# Patient Record
Sex: Male | Born: 1955 | Race: White | Hispanic: No | Marital: Married | Smoking: Former smoker
Health system: Southern US, Community
[De-identification: ages and names within clinical notes are randomized; demographics above are authoritative.]

## PROBLEM LIST (undated history)

## (undated) DIAGNOSIS — I10 Essential (primary) hypertension: Secondary | ICD-10-CM

## (undated) DIAGNOSIS — T7840XA Allergy, unspecified, initial encounter: Secondary | ICD-10-CM

## (undated) DIAGNOSIS — E119 Type 2 diabetes mellitus without complications: Secondary | ICD-10-CM

## (undated) HISTORY — DX: Essential (primary) hypertension: I10

## (undated) HISTORY — PX: TONSILLECTOMY: SUR1361

## (undated) HISTORY — DX: Allergy, unspecified, initial encounter: T78.40XA

## (undated) HISTORY — DX: Type 2 diabetes mellitus without complications: E11.9

---

## 2004-10-12 ENCOUNTER — Ambulatory Visit: Payer: Self-pay | Admitting: Gastroenterology

## 2004-10-20 ENCOUNTER — Ambulatory Visit: Payer: Self-pay | Admitting: Gastroenterology

## 2005-02-15 ENCOUNTER — Ambulatory Visit: Payer: Self-pay | Admitting: Gastroenterology

## 2009-07-01 ENCOUNTER — Encounter: Admission: RE | Admit: 2009-07-01 | Discharge: 2009-09-10 | Payer: Self-pay | Admitting: Internal Medicine

## 2009-12-16 ENCOUNTER — Encounter
Admission: RE | Admit: 2009-12-16 | Discharge: 2010-03-16 | Payer: Self-pay | Source: Home / Self Care | Admitting: Internal Medicine

## 2010-05-24 ENCOUNTER — Encounter
Admission: RE | Admit: 2010-05-24 | Discharge: 2010-07-29 | Payer: Self-pay | Source: Home / Self Care | Admitting: Internal Medicine

## 2010-08-25 ENCOUNTER — Encounter
Admission: RE | Admit: 2010-08-25 | Discharge: 2010-08-25 | Payer: Self-pay | Source: Home / Self Care | Attending: Internal Medicine | Admitting: Internal Medicine

## 2010-12-01 ENCOUNTER — Ambulatory Visit: Payer: Self-pay | Admitting: Dietician

## 2010-12-01 ENCOUNTER — Encounter: Admit: 2010-12-01 | Payer: Self-pay | Admitting: Internal Medicine

## 2011-12-31 ENCOUNTER — Other Ambulatory Visit: Payer: Self-pay | Admitting: Internal Medicine

## 2011-12-31 MED ORDER — METFORMIN HCL ER 500 MG PO TB24: 1000.0000 mg | ORAL_TABLET | Freq: Every day | ORAL | Status: DC

## 2012-02-03 ENCOUNTER — Ambulatory Visit (INDEPENDENT_AMBULATORY_CARE_PROVIDER_SITE_OTHER): Payer: 59 | Admitting: Internal Medicine

## 2012-02-03 VITALS — BP 102/64 | HR 80 | Temp 98.6°F | Resp 16 | Ht 68.58 in | Wt 226.2 lb

## 2012-02-03 DIAGNOSIS — F419 Anxiety disorder, unspecified: Secondary | ICD-10-CM

## 2012-02-03 DIAGNOSIS — E785 Hyperlipidemia, unspecified: Secondary | ICD-10-CM

## 2012-02-03 DIAGNOSIS — E119 Type 2 diabetes mellitus without complications: Secondary | ICD-10-CM

## 2012-02-03 DIAGNOSIS — Z6833 Body mass index (BMI) 33.0-33.9, adult: Secondary | ICD-10-CM

## 2012-02-03 DIAGNOSIS — J309 Allergic rhinitis, unspecified: Secondary | ICD-10-CM | POA: Insufficient documentation

## 2012-02-03 LAB — POCT GLYCOSYLATED HEMOGLOBIN (HGB A1C): Hemoglobin A1C: 6.9

## 2012-02-03 MED ORDER — METFORMIN HCL ER (OSM) 1000 MG PO TB24: 1000.0000 mg | ORAL_TABLET | Freq: Every day | ORAL | Status: DC

## 2012-02-03 MED ORDER — LISINOPRIL 2.5 MG PO TABS: 2.5000 mg | ORAL_TABLET | Freq: Every day | ORAL | Status: DC

## 2012-02-03 NOTE — Patient Instructions (Signed)
Reviewing your past records indicated that the pharmacy has misunderstood your prescription. I have called in a new prescription so that you can take all ofyour medication for diabetes in one dose at suppertime/// so you'll no longer have to do this twice a day. Your correct dose is 1000 mg of an extended release tablet at supper. It's also possible for you to take your lisinopril at the same time. You're due for recheck with repeat laboratory in the months and your prescription should last until that time.

## 2012-02-03 NOTE — Progress Notes (Signed)
  Subjective:    Patient ID: Steve Hall, male    DOB: [birth year], 56 y.o.   MRN: [id number]  HPI He was told by his insurance company or his pharmacy that he needed a visit to get more medication. When he was here in November his medications were refilled for one year. There are several instances where his dose has been confused between twice a day and once a day regimens and he is still not clear but appears to be taking 1000 mg a day in 2 divided doses by breaking his pill in half.  He is doing well but has not lost weight/he is just starting gym this week   Review of SystemsNo new symptoms     Objective:   Physical ExamVital signs are stable with elevated weight Heart is regular without murmurs rubs and gallops There no sensory deficits of the feet  Results for orders placed in visit on 02/03/12  POCT GLYCOSYLATED HEMOGLOBIN (HGB A1C)      Component Value Range   Hemoglobin A1C 6.9         Assessment & Plan:  Problem #1 adult onset diabetes He should be taking 1000 mg extended release metformin at supper #92 refills recheck at regular physical in 9 months/He will call the pharmacy charges more at this dose and wants him to use the 500 mg extended release twice a day/he will continue checking random morning blood sugars and followup sooner if these are out of control Lisinopril 2.5 mg #90 one daily with 2 refills for kidney protection His last LDL was just over 100/he has had a past reaction to statins and so we will approach this problem with exercise and diet  His big task is to lose weight!!!

## 2012-06-03 ENCOUNTER — Other Ambulatory Visit: Payer: Self-pay | Admitting: Internal Medicine

## 2012-08-26 ENCOUNTER — Other Ambulatory Visit: Payer: Self-pay | Admitting: Internal Medicine

## 2012-09-23 ENCOUNTER — Ambulatory Visit (INDEPENDENT_AMBULATORY_CARE_PROVIDER_SITE_OTHER): Payer: 59 | Admitting: Internal Medicine

## 2012-09-23 VITALS — BP 125/79 | HR 78 | Temp 97.6°F | Resp 18 | Ht 68.5 in | Wt 231.0 lb

## 2012-09-23 DIAGNOSIS — J309 Allergic rhinitis, unspecified: Secondary | ICD-10-CM

## 2012-09-23 DIAGNOSIS — E785 Hyperlipidemia, unspecified: Secondary | ICD-10-CM

## 2012-09-23 DIAGNOSIS — Z6833 Body mass index (BMI) 33.0-33.9, adult: Secondary | ICD-10-CM

## 2012-09-23 DIAGNOSIS — E119 Type 2 diabetes mellitus without complications: Secondary | ICD-10-CM

## 2012-09-23 DIAGNOSIS — Z0000 Encounter for general adult medical examination without abnormal findings: Secondary | ICD-10-CM

## 2012-09-23 DIAGNOSIS — B356 Tinea cruris: Secondary | ICD-10-CM

## 2012-09-23 DIAGNOSIS — F419 Anxiety disorder, unspecified: Secondary | ICD-10-CM

## 2012-09-23 DIAGNOSIS — L02419 Cutaneous abscess of limb, unspecified: Secondary | ICD-10-CM

## 2012-09-23 DIAGNOSIS — Z23 Encounter for immunization: Secondary | ICD-10-CM

## 2012-09-23 DIAGNOSIS — Z125 Encounter for screening for malignant neoplasm of prostate: Secondary | ICD-10-CM

## 2012-09-23 DIAGNOSIS — Z2839 Other underimmunization status: Secondary | ICD-10-CM

## 2012-09-23 LAB — LIPID PANEL
Cholesterol: 198 mg/dL (ref 0–200)
Total CHOL/HDL Ratio: 5.4 Ratio

## 2012-09-23 LAB — COMPREHENSIVE METABOLIC PANEL
ALT: 25 U/L (ref 0–53)
AST: 18 U/L (ref 0–37)
Albumin: 4.7 g/dL (ref 3.5–5.2)
Alkaline Phosphatase: 65 U/L (ref 39–117)
Calcium: 10.2 mg/dL (ref 8.4–10.5)
Chloride: 96 mEq/L (ref 96–112)
Potassium: 4.8 mEq/L (ref 3.5–5.3)

## 2012-09-23 LAB — POCT UA - MICROSCOPIC ONLY
Casts, Ur, LPF, POC: NEGATIVE
Crystals, Ur, HPF, POC: NEGATIVE

## 2012-09-23 LAB — TSH: TSH: 1.963 u[IU]/mL (ref 0.350–4.500)

## 2012-09-23 LAB — POCT CBC
Granulocyte percent: 58.8 %G (ref 37–80)
Hemoglobin: 15.4 g/dL (ref 14.1–18.1)
MID (cbc): 0.7 (ref 0–0.9)
MPV: 7.2 fL (ref 0–99.8)
POC Granulocyte: 4.6 (ref 2–6.9)
POC MID %: 8.4 %M (ref 0–12)
Platelet Count, POC: 299 10*3/uL (ref 142–424)
RBC: 5.01 M/uL (ref 4.69–6.13)

## 2012-09-23 LAB — POCT GLYCOSYLATED HEMOGLOBIN (HGB A1C): Hemoglobin A1C: 7.6

## 2012-09-23 LAB — POCT URINALYSIS DIPSTICK
Ketones, UA: NEGATIVE
Protein, UA: NEGATIVE
Spec Grav, UA: 1.015
Urobilinogen, UA: 0.2
pH, UA: 7

## 2012-09-23 MED ORDER — METFORMIN HCL ER (OSM) 1000 MG PO TB24: 1000.0000 mg | ORAL_TABLET | Freq: Every day | ORAL | Status: DC

## 2012-09-23 MED ORDER — LISINOPRIL 2.5 MG PO TABS: 2.5000 mg | ORAL_TABLET | Freq: Every day | ORAL | Status: DC

## 2012-09-23 MED ORDER — KETOCONAZOLE 2 % EX CREA: TOPICAL_CREAM | Freq: Every day | CUTANEOUS | Status: DC

## 2012-09-23 MED ORDER — DOXYCYCLINE HYCLATE 100 MG PO TABS: 100.0000 mg | ORAL_TABLET | Freq: Two times a day (BID) | ORAL | Status: DC

## 2012-09-23 MED ORDER — GLUCOSE BLOOD VI STRP: ORAL_STRIP | Status: DC

## 2012-09-23 NOTE — Progress Notes (Signed)
Subjective:    Patient ID: Steve Hall, male    DOB: 09-05-1956, 56 y.o.   MRN: [id number]  HPIannual PE Patient Active Problem List  Diagnosis  . DM (diabetes mellitus)  . Allergic rhinitis  . BMI 33.0-33.9,adult  . Hyperlipidemia  . Anxiety   Doing well/feels well/has gained 5-10 lbs but says probably "muscle" anx stable DM-tests freq and is OK  Never pneumnovax Colonoscopy2-3 yr ago Shingles vac too expensive cause wouldn't allow pharm admin Td 08 joined [employer] recently up to 3x /wk Former smoker Statin allergy Review of Systems Epistaxis L nostril 6 mos   no vision changes No chest pain or palpitations No peripheral edema No dyspnea on exertion No abdominal pain No peripheral sensory losses Has a funny tender area on left leg that he thinks is a spider bite present for one week Rest review of systems negative Objective:   Physical Exam Vital signs stable except weight 231 pounds HEENT clear No thyromegaly or lymphadenopathy Heart regular without murmur/no carotid bruits Abdomen Central obesity/no organomegaly or masses/no current abdominal bruit Extremities without edema/no sensory losses and feet to filament testing Neurological intact Skin with 3 cm red papule with 0.3 cm central pustule at the left popliteal area Psychological stable Tinea cruris present       Results for orders placed in visit on 09/23/12  LIPID PANEL      Component Value Range   Cholesterol 198  0 - 200 mg/dL   Triglycerides 409 (*) <150 mg/dL   HDL 37 (*) >81 mg/dL   Total CHOL/HDL Ratio 5.4     VLDL 50 (*) 0 - 40 mg/dL   LDL Cholesterol 191 (*) 0 - 99 mg/dL  POCT GLYCOSYLATED HEMOGLOBIN (HGB A1C)      Component Value Range   Hemoglobin A1C 7.6    POCT CBC      Component Value Range   WBC 7.8  4.6 - 10.2 K/uL   Lymph, poc 2.6  0.6 - 3.4   POC LYMPH PERCENT 32.8  10 - 50 %L   MID (cbc) 0.7  0 - 0.9   POC MID % 8.4  0 - 12 %M   POC Granulocyte 4.6  2 - 6.9   Granulocyte percent 58.8  37 - 80 %G   RBC 5.01  4.69 - 6.13 M/uL   Hemoglobin 15.4  14.1 - 18.1 g/dL   HCT, POC 47.8  29.5 - 53.7 %   MCV 94.3  80 - 97 fL   MCH, POC 30.7  27 - 31.2 pg   MCHC 32.6  31.8 - 35.4 g/dL   RDW, POC 62.1     Platelet Count, POC 299  142 - 424 K/uL   MPV 7.2  0 - 99.8 fL  TSH      Component Value Range   TSH 1.963  0.350 - 4.500 uIU/mL  COMPREHENSIVE METABOLIC PANEL      Component Value Range   Sodium 133 (*) 135 - 145 mEq/L   Potassium 4.8  3.5 - 5.3 mEq/L   Chloride 96  96 - 112 mEq/L   CO2 30  19 - 32 mEq/L   Glucose, Bld 133 (*) 70 - 99 mg/dL   BUN 15  6 - 23 mg/dL   Creat 3.08  6.57 - 8.46 mg/dL   Total Bilirubin 0.9  0.3 - 1.2 mg/dL   Alkaline Phosphatase 65  39 - 117 U/L   AST 18  0 - 37 U/L  ALT 25  0 - 53 U/L   Total Protein 7.3  6.0 - 8.3 g/dL   Albumin 4.7  3.5 - 5.2 g/dL   Calcium 40.9  8.4 - 81.1 mg/dL  PSA      Component Value Range   PSA    <=4.00 ng/mL  POCT UA - MICROSCOPIC ONLY      Component Value Range   WBC, Ur, HPF, POC 3-5     RBC, urine, microscopic 0-1     Bacteria, U Microscopic trace     Mucus, UA neg     Epithelial cells, urine per micros 1-2     Crystals, Ur, HPF, POC neg     Casts, Ur, LPF, POC neg     Yeast, UA neg    POCT URINALYSIS DIPSTICK      Component Value Range   Color, UA yellow     Clarity, UA clear     Glucose, UA neg     Bilirubin, UA neg     Ketones, UA neg     Spec Grav, UA 1.015     Blood, UA neg     pH, UA 7.0     Protein, UA neg     Urobilinogen, UA 0.2     Nitrite, UA neg     Leukocytes, UA Negative    MICROALBUMIN, URINE      Component Value Range   Microalb, Ur    0.00 - 1.89 mg/dL    Assessment & Plan:  Annual physical exam 1. Annual physical exam    2. Prostate cancer screening  POCT CBC, TSH, Comprehensive metabolic panel, PSA  3. Type II or unspecified type diabetes mellitus without mention of complication, not stated as uncontrolled  Lipid panel, POCT glycosylated  hemoglobin (Hb A1C), POCT CBC, TSH, Comprehensive metabolic panel, POCT UA - Microscopic Only, POCT urinalysis dipstick, Microalbumin, urine, lisinopril (PRINIVIL,ZESTRIL) 2.5 MG tablet, metformin (FORTAMET) 1000 MG (OSM) 24 hr tablet, glucose blood (ONE TOUCH ULTRA TEST) test strip  4. Hyperlipidemia   history of statin allergy   5. Anxiety    6. Allergic rhinitis    7. Abscess of leg  Wound culture  8. BMI 33.0-33.9,adult    9. Tinea cruris  ketoconazole (NIZORAL) 2 % cream  10. Not vaccinated against Streptococcus pneumoniae  Pneumococcal polysaccharide vaccine 23-valent greater than or equal to 2yo subcutaneous/IM   Start doxycycline Meds ordered this encounter  Medications  . lisinopril (PRINIVIL,ZESTRIL) 2.5 MG tablet    Sig: Take 1 tablet (2.5 mg total) by mouth daily.    Dispense:  90 tablet    Refill:  3    Needs office visit and labs  . metformin (FORTAMET) 1000 MG (OSM) 24 hr tablet    Sig: Take 1 tablet (1,000 mg total) by mouth daily with supper.    Dispense:  90 tablet    Refill:  2    NEEDS OFFICE VISIT  . glucose blood (ONE TOUCH ULTRA TEST) test strip    Sig: Use as instructed    Dispense:  100 each    Refill:  2  . doxycycline (VIBRA-TABS) 100 MG tablet    Sig: Take 1 tablet (100 mg total) by mouth 2 (two) times daily.    Dispense:  20 tablet    Refill:  0  . ketoconazole (NIZORAL) 2 % cream    Sig: Apply topically daily.    Dispense:  30 g    Refill:  0   I  suggested an increase in metformin but he would rather try significant weight loss through exercise and be retested in 6 months Zostavax Pneumovax ordered but are not sure it was given at this visit

## 2012-09-23 NOTE — Patient Instructions (Signed)
Check on shingles vaccine

## 2012-09-23 NOTE — Progress Notes (Signed)
  Subjective:    Patient ID: Steve Hall, male    DOB: [birth year], 56 y.o.   MRN: [id number]  HPI patient presents fasting this morning and will be having a physical later today with Dr. Merla Riches.     Review of Systems     Objective:   Physical Exam        Assessment & Plan:

## 2012-09-24 ENCOUNTER — Encounter: Payer: Self-pay | Admitting: Internal Medicine

## 2012-09-24 LAB — MICROALBUMIN, URINE: Microalb, Ur: 0.5 mg/dL (ref 0.00–1.89)

## 2012-09-26 LAB — WOUND CULTURE
Gram Stain: NONE SEEN
Gram Stain: NONE SEEN

## 2012-10-28 ENCOUNTER — Other Ambulatory Visit: Payer: Self-pay | Admitting: Internal Medicine

## 2012-12-14 ENCOUNTER — Other Ambulatory Visit: Payer: Self-pay

## 2012-12-25 ENCOUNTER — Telehealth: Payer: Self-pay

## 2013-01-30 ENCOUNTER — Other Ambulatory Visit: Payer: Self-pay | Admitting: Internal Medicine

## 2013-02-18 ENCOUNTER — Ambulatory Visit (INDEPENDENT_AMBULATORY_CARE_PROVIDER_SITE_OTHER): Payer: 59 | Admitting: Internal Medicine

## 2013-02-18 VITALS — BP 118/76 | HR 71 | Temp 97.7°F | Resp 18 | Ht 67.5 in | Wt 222.0 lb

## 2013-02-18 DIAGNOSIS — E785 Hyperlipidemia, unspecified: Secondary | ICD-10-CM

## 2013-02-18 DIAGNOSIS — Z6833 Body mass index (BMI) 33.0-33.9, adult: Secondary | ICD-10-CM

## 2013-02-18 DIAGNOSIS — E119 Type 2 diabetes mellitus without complications: Secondary | ICD-10-CM

## 2013-02-18 LAB — LIPID PANEL
Cholesterol: 177 mg/dL (ref 0–200)
HDL: 30 mg/dL — ABNORMAL LOW
LDL Cholesterol: 81 mg/dL (ref 0–99)
Total CHOL/HDL Ratio: 5.9 ratio
Triglycerides: 331 mg/dL — ABNORMAL HIGH
VLDL: 66 mg/dL — ABNORMAL HIGH (ref 0–40)

## 2013-02-18 LAB — POCT GLYCOSYLATED HEMOGLOBIN (HGB A1C): Hemoglobin A1C: 7.2

## 2013-02-18 MED ORDER — METFORMIN HCL ER 500 MG PO TB24: ORAL_TABLET | ORAL | Status: DC

## 2013-02-18 NOTE — Progress Notes (Signed)
  Subjective:    Patient ID: Steve Hall, male    DOB: 09/20/1956, 57 y.o.   MRN: [id number]  HPI  Patient Active Problem List  Diagnosis  . DM (diabetes mellitus)  . Allergic rhinitis  . BMI 33.0-33.9,adult  . Hyperlipidemia  . Anxiety   Current outpatient prescriptions:glucose blood (ONE TOUCH ULTRA TEST) test strip, Use as instructed, Disp: 100 each, Rfl: 2;  lisinopril (PRINIVIL,ZESTRIL) 2.5 MG tablet, Take 1 tablet (2.5 mg total) by mouth daily., Disp: 90 tablet, Rfl: 3;  metFORMIN (GLUCOPHAGE-XR) 500 MG 24 hr tablet, TAKE 2 TABLETS (1000MG ) BY MOUTH EVERY DAY WITH SUPPER, Disp: 180 tablet, Rfl: 1;  ketoconazole (NIZORAL) 2 % cream, Apply topically daily., Disp: 30 g, Rfl: 0  Here for labs //no new sxt Upset at copay for last OV despite CPE   Review of Systems  Constitutional: Negative.   HENT: Negative.   Eyes: Negative.   Respiratory: Negative.   Cardiovascular: Negative.   Gastrointestinal: Negative.   Neurological: Negative.        Objective:   Physical Exam BP 118/76  Pulse 71  Temp(Src) 97.7 F (36.5 C) (Oral)  Resp 18  Ht 5' 7.5" (1.715 m)  Wt 222 lb (100.699 kg)  BMI 34.24 kg/m2  SpO2 99% heent -cl Ht reg no mrcg Lungs cl Ext no edem and good pp Neuro int incl sens feet       Results for orders placed in visit on 02/18/13  POCT GLYCOSYLATED HEMOGLOBIN (HGB A1C)      Result Value Range   Hemoglobin A1C 7.2      Assessment & Plan:  Other and unspecified hyperlipidemia - Plan: Lipid panel  Type II or unspecified type diabetes mellitus without mention of complication, not stated as uncontrolled - Plan: POCT glycosylated hemoglobin (Hb A1C)  Hyperlipidemia--allrxn ststins  DM (diabetes mellitus)stab-needs wt loss  BMI 33.0-33.9,adult  Meds ordered this encounter  Medications  . metFORMIN (GLUCOPHAGE-XR) 500 MG 24 hr tablet    Sig: TAKE 2 TABLETS (1000MG ) BY MOUTH EVERY DAY WITH SUPPER    Dispense:  180 tablet    Refill:  1  lose  wt!!!!! Cont oth meds  F/u 11/14

## 2013-02-19 NOTE — Progress Notes (Signed)
CPE made for 12/3 with Dr. Merla Riches. Appt details mailed to pt home.

## 2013-02-20 ENCOUNTER — Encounter: Payer: Self-pay | Admitting: Internal Medicine

## 2013-08-21 ENCOUNTER — Other Ambulatory Visit: Payer: Self-pay | Admitting: Internal Medicine

## 2013-09-04 ENCOUNTER — Other Ambulatory Visit: Payer: Self-pay

## 2013-09-15 ENCOUNTER — Encounter: Payer: Self-pay | Admitting: Gastroenterology

## 2013-09-16 ENCOUNTER — Ambulatory Visit (INDEPENDENT_AMBULATORY_CARE_PROVIDER_SITE_OTHER): Payer: 59 | Admitting: Internal Medicine

## 2013-09-16 VITALS — BP 118/70 | HR 78 | Temp 98.7°F | Resp 18 | Ht 68.75 in | Wt 221.2 lb

## 2013-09-16 DIAGNOSIS — B356 Tinea cruris: Secondary | ICD-10-CM

## 2013-09-16 DIAGNOSIS — E785 Hyperlipidemia, unspecified: Secondary | ICD-10-CM

## 2013-09-16 DIAGNOSIS — E119 Type 2 diabetes mellitus without complications: Secondary | ICD-10-CM

## 2013-09-16 DIAGNOSIS — Z125 Encounter for screening for malignant neoplasm of prostate: Secondary | ICD-10-CM

## 2013-09-16 DIAGNOSIS — Z6833 Body mass index (BMI) 33.0-33.9, adult: Secondary | ICD-10-CM

## 2013-09-16 DIAGNOSIS — Z0000 Encounter for general adult medical examination without abnormal findings: Secondary | ICD-10-CM

## 2013-09-16 LAB — LIPID PANEL
Cholesterol: 182 mg/dL (ref 0–200)
HDL: 33 mg/dL — ABNORMAL LOW (ref >39)
LDL Cholesterol: 112 mg/dL — ABNORMAL HIGH (ref 0–99)
VLDL: 37 mg/dL (ref 0–40)

## 2013-09-16 LAB — COMPREHENSIVE METABOLIC PANEL
Albumin: 4.4 g/dL (ref 3.5–5.2)
Alkaline Phosphatase: 65 U/L (ref 39–117)
BUN: 14 mg/dL (ref 6–23)
Calcium: 9.6 mg/dL (ref 8.4–10.5)
Glucose, Bld: 143 mg/dL — ABNORMAL HIGH (ref 70–99)
Potassium: 4.2 mEq/L (ref 3.5–5.3)

## 2013-09-16 LAB — POCT CBC
HCT, POC: 47.2 % (ref 43.5–53.7)
Hemoglobin: 15.5 g/dL (ref 14.1–18.1)
MCH, POC: 31.4 pg — AB (ref 27–31.2)
MCV: 95.7 fL (ref 80–97)
MID (cbc): 0.6 (ref 0–0.9)
Platelet Count, POC: 269 10*3/uL (ref 142–424)
RBC: 4.93 M/uL (ref 4.69–6.13)
WBC: 7.8 10*3/uL (ref 4.6–10.2)

## 2013-09-16 LAB — POCT GLYCOSYLATED HEMOGLOBIN (HGB A1C): Hemoglobin A1C: 6.9

## 2013-09-16 LAB — MICROALBUMIN, URINE: Microalb, Ur: 0.5 mg/dL (ref 0.00–1.89)

## 2013-09-16 MED ORDER — LISINOPRIL 2.5 MG PO TABS: 2.5000 mg | ORAL_TABLET | Freq: Every day | ORAL | Status: DC

## 2013-09-16 MED ORDER — KETOCONAZOLE 2 % EX CREA: TOPICAL_CREAM | Freq: Every day | CUTANEOUS | Status: DC

## 2013-09-16 MED ORDER — METFORMIN HCL ER 500 MG PO TB24: ORAL_TABLET | ORAL | Status: DC

## 2013-09-16 MED ORDER — ONETOUCH ULTRA SYSTEM W/DEVICE KIT: 1.0000 | PACK | Freq: Once | Status: AC

## 2013-09-16 NOTE — Progress Notes (Signed)
This chart was scribed for Steve Sia, MD, by Steve Hall, ED Scribe. This patient's care was started at 9:36 AM.   Subjective:    Patient ID: Steve Hall, male    DOB: [birth year], 57 y.o.   MRN: [id number]  Chief Complaint  Patient presents with  . Annual Exam   HPI  Steve Hall is a 57 y.o. male who presents to [facility] for an annual exam.   He states that he has struggled with focusing for extended periods of time, and he wonders if he has Attention Deficit Disorder. 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 However, he states he has difficulty with sustained reading of books. Hyperact as child.  The pt reports he has increased his exercise regiment. He reports that any increased weight gain may be attributed to two recent vacations during which he did not monitor his eating habits.    He denies any issues with his medications, including metformin and lisinopril. The pt states the Nizoral cream has caused improvement to the recurrent yeast infections he has experienced to his groin.    The pt reports he has stopped using supplemental fish oil as well as other vitamin/mineral supplements. He has not noticed a change to his health. He also no longer takes the prescribed anxiety medication due to a change of jobs and accompanying decreased stressors.    He also reports his one-touch blood sugar monitor is not functioning appropriately.   Recent colo//imun utd Past Medical History  Diagnosis Date  . Hypertension   . Allergy   . Diabetes mellitus without complication     Allergies  Allergen Reactions  . Statins Hives    Review of Systems  Constitutional: Positive for activity change (Increased exercise).  Psychiatric/Behavioral: Positive for decreased concentration. The patient is hyperactive.   other systems all negative  Triage Vitals: BP 118/70  Pulse 78  Temp(Src) 98.7 F (37.1 C) (Oral)  Resp 18  Ht 5' 8.75" (1.746  m)  Wt 221 lb 3.2 oz (100.336 kg)  BMI 32.91 kg/m2  SpO2 96%     Objective:   Physical Exam  Nursing note and vitals reviewed. Constitutional: He is oriented to person, place, and time. He appears well-developed and well-nourished. No distress.  HENT:  Head: Normocephalic and atraumatic.  Right Ear: External ear normal.  Left Ear: External ear normal.  Nose: Nose normal.  Mouth/Throat: Oropharynx is clear and moist.  Tms and canals clear  Eyes: Conjunctivae and EOM are normal. Pupils are equal, round, and reactive to light.  Neck: Normal range of motion. Neck supple. No tracheal deviation present. No thyromegaly present.  Cardiovascular: Normal rate, regular rhythm, normal heart sounds and intact distal pulses.   No murmur heard. Pulmonary/Chest: Effort normal and breath sounds normal. No respiratory distress. He has no wheezes. He has no rales.  Abdominal: Soft. Bowel sounds are normal. He exhibits no distension and no mass. There is no tenderness. There is no rebound and no guarding.  No hepatosplenomegaly  Genitourinary: Rectum normal, prostate normal and penis normal. Guaiac negative stool.  Pros symm and soft w/out nod  Musculoskeletal: Normal range of motion. He exhibits no edema and no tenderness.  Lymphadenopathy:    He has no cervical adenopathy.  Neurological: He is alert and oriented to person, place, and time. He has normal reflexes. No cranial nerve deficit. He exhibits normal muscle tone. Coordination normal.  Skin: Skin is warm and dry.  No rash noted.  Psychiatric: He has a normal mood and affect. His behavior is normal. Judgment and thought content normal.   Results for orders placed in visit on 09/16/13  POCT GLYCOSYLATED HEMOGLOBIN (HGB A1C)      Result Value Range   Hemoglobin A1C 6.9    POCT CBC      Result Value Range   WBC 7.8  4.6 - 10.2 K/uL   Lymph, poc 2.4  0.6 - 3.4   POC LYMPH PERCENT 30.9  10 - 50 %L   MID (cbc) 0.6  0 - 0.9   POC MID % 7.2  0 -  12 %M   POC Granulocyte 4.8  2 - 6.9   Granulocyte percent 61.9  37 - 80 %G   RBC 4.93  4.69 - 6.13 M/uL   Hemoglobin 15.5  14.1 - 18.1 g/dL   HCT, POC 04.5  40.9 - 53.7 %   MCV 95.7  80 - 97 fL   MCH, POC 31.4 (*) 27 - 31.2 pg   MCHC 32.8  31.8 - 35.4 g/dL   RDW, POC 81.1     Platelet Count, POC 269  142 - 424 K/uL   MPV 7.5  0 - 99.8 fL  IFOBT (OCCULT BLOOD)      Result Value Range   IFOBT Negative         Assessment & Plan:  Annual BMI 33.0-33.9,adult  DM (diabetes mellitus) -now contr  Hyperlipidemia -   Annual physical exam -   Screening for prostate cancer   Tinea cruris - resolved with rx  Meds ordered this encounter  Medications  . ketoconazole (NIZORAL) 2 % cream    Sig: Apply topically daily.    Dispense:  30 g    Refill:  1  . lisinopril (PRINIVIL,ZESTRIL) 2.5 MG tablet    Sig: Take 1 tablet (2.5 mg total) by mouth daily.    Dispense:  90 tablet    Refill:  3    Needs office visit and labs  . metFORMIN (GLUCOPHAGE-XR) 500 MG 24 hr tablet    Sig: TAKE 2 TABLETS (1000MG ) BY MOUTH EVERY DAY WITH SUPPER    Dispense:  180 tablet    Refill:  3  . Blood Glucose Monitoring Suppl (ONE TOUCH ULTRA SYSTEM KIT) W/DEVICE KIT    Sig: 1 kit by Does not apply route once.    Dispense:  1 each    Refill:  0  notify labs Weight loss!!!! Consider Dr Evelene Croon for ADD eval---ref to ADD for [facility]

## 2013-09-17 ENCOUNTER — Encounter: Payer: Self-pay | Admitting: Internal Medicine

## 2013-09-20 ENCOUNTER — Other Ambulatory Visit: Payer: Self-pay | Admitting: Internal Medicine

## 2013-10-01 ENCOUNTER — Encounter: Payer: 59 | Admitting: Internal Medicine

## 2013-10-28 ENCOUNTER — Telehealth: Payer: Self-pay

## 2013-10-28 MED ORDER — TRIAMCINOLONE ACETONIDE 0.1 % EX CREA: 1.0000 "application " | TOPICAL_CREAM | Freq: Two times a day (BID) | CUTANEOUS | Status: DC

## 2013-10-28 NOTE — Telephone Encounter (Signed)
Patient needs refill of [facility] moisturizin/triamcinolone. He says Dr. Merla Riches has prescribed this for him in the past and the most recent cream he prescribed is not working. He would like Dr. Merla Riches to rx this pls.

## 2013-10-28 NOTE — Telephone Encounter (Signed)
Let's get the old paper record so I can review it.

## 2013-10-28 NOTE — Telephone Encounter (Signed)
Thanks, I did not think paper was able to be located but it was, and I found it! Eucerin : Triamcinolone 1:1 Ratio sent .

## 2013-10-28 NOTE — Telephone Encounter (Signed)
This is from 2006, pharmacist states looks like something with triamcinolone, but he did not know what was mixed with, he could not read label, indicates was not even from his store.

## 2013-10-28 NOTE — Telephone Encounter (Signed)
Please clarify the product-a mixture of moisturizer and triamcinolone?  This may be in the paper record. Dr. Merla Riches says it's OK to refill.

## 2014-04-02 ENCOUNTER — Encounter: Payer: Self-pay | Admitting: Gastroenterology

## 2014-09-11 ENCOUNTER — Ambulatory Visit (INDEPENDENT_AMBULATORY_CARE_PROVIDER_SITE_OTHER): Payer: 59 | Admitting: Internal Medicine

## 2014-09-11 VITALS — BP 132/98 | HR 85 | Temp 97.4°F | Resp 18 | Ht 68.25 in | Wt 214.2 lb

## 2014-09-11 DIAGNOSIS — E785 Hyperlipidemia, unspecified: Secondary | ICD-10-CM

## 2014-09-11 DIAGNOSIS — E119 Type 2 diabetes mellitus without complications: Secondary | ICD-10-CM

## 2014-09-11 DIAGNOSIS — Z0000 Encounter for general adult medical examination without abnormal findings: Secondary | ICD-10-CM

## 2014-09-11 DIAGNOSIS — Z125 Encounter for screening for malignant neoplasm of prostate: Secondary | ICD-10-CM

## 2014-09-11 LAB — COMPREHENSIVE METABOLIC PANEL
ALT: 27 U/L (ref 0–53)
AST: 19 U/L (ref 0–37)
Albumin: 4.6 g/dL (ref 3.5–5.2)
Alkaline Phosphatase: 65 U/L (ref 39–117)
BILIRUBIN TOTAL: 0.9 mg/dL (ref 0.2–1.2)
BUN: 15 mg/dL (ref 6–23)
CO2: 28 mEq/L (ref 19–32)
CREATININE: 0.92 mg/dL (ref 0.50–1.35)
Calcium: 9.8 mg/dL (ref 8.4–10.5)
Chloride: 98 mEq/L (ref 96–112)
Glucose, Bld: 143 mg/dL — ABNORMAL HIGH (ref 70–99)
Potassium: 4.1 mEq/L (ref 3.5–5.3)
Sodium: 137 mEq/L (ref 135–145)
Total Protein: 7.3 g/dL (ref 6.0–8.3)

## 2014-09-11 LAB — CBC
HCT: 46.1 % (ref 39.0–52.0)
Hemoglobin: 16.4 g/dL (ref 13.0–17.0)
MCH: 31.8 pg (ref 26.0–34.0)
MCHC: 35.6 g/dL (ref 30.0–36.0)
MCV: 89.3 fL (ref 78.0–100.0)
Platelets: 227 10*3/uL (ref 150–400)
RBC: 5.16 MIL/uL (ref 4.22–5.81)
RDW: 12.7 % (ref 11.5–15.5)
WBC: 7.1 10*3/uL (ref 4.0–10.5)

## 2014-09-11 LAB — LIPID PANEL
CHOL/HDL RATIO: 4.5 ratio
Cholesterol: 200 mg/dL (ref 0–200)
HDL: 44 mg/dL (ref >39)
LDL Cholesterol: 129 mg/dL — ABNORMAL HIGH (ref 0–99)
Triglycerides: 133 mg/dL (ref <150)
VLDL: 27 mg/dL (ref 0–40)

## 2014-09-11 LAB — POCT GLYCOSYLATED HEMOGLOBIN (HGB A1C): HEMOGLOBIN A1C: 6.8

## 2014-09-11 NOTE — Progress Notes (Signed)
Subjective:  This chart was scribed for Steve Lin, MD by Steve Hall, ED Scribe. This patient was seen in room 5 and the patient's care was started at 11:31 AM.    Patient ID: Steve Hall, male    DOB: Sep 03, 1956, 58 y.o.   MRN: [id number]  Chief Complaint  Patient presents with  . CPE    HPI  HPI Comments: Steve Hall is a 58 y.o. male who presents to [facility] for a complete physical exam. He states that he has been well this past year, with no new problems. He also requests a prescription refill of metformin and lisinopril. Patient's last self-measured blood sugar is 150, but he suspects that this is due to a carb-heavy meal the night prior. He has lost about 7 lbs within the past year due to an instated exercise and diet regimen. He reports recent weight gain in the past 3 months due to a breakup. He reports a statin drug intolerance. He has had vision by an ophthalmologist and dental check-ups within the last year. Patient does not recall his last tetanus shot. He had been planning to obtain a shingles vaccine but has been having difficulty doing so due to insurance. Patient expresses mild skepticism of vaccines from self-research.   Patient Active Problem List   Diagnosis Date Noted  . DM (diabetes mellitus) 02/03/2012  . Allergic rhinitis 02/03/2012  . BMI 33.0-33.9,adult 02/03/2012  . Hyperlipidemia 02/03/2012  . Anxiety 02/03/2012   Prior to Admission medications   Medication Sig Start Date End Date Taking? Authorizing Provider  Blood Glucose Monitoring Suppl (ONE TOUCH ULTRA SYSTEM KIT) W/DEVICE KIT 1 kit by Does not apply route once. 09/16/13  Yes Leandrew Koyanagi, MD  glucose blood (ONE TOUCH ULTRA TEST) test strip Use as instructed 09/23/12  Yes Leandrew Koyanagi, MD  lisinopril (PRINIVIL,ZESTRIL) 2.5 MG tablet Take 1 tablet (2.5 mg total) by mouth daily. 09/16/13  Yes Leandrew Koyanagi, MD  loratadine (CLARITIN) 10 MG tablet Take 10 mg by mouth daily.   Yes  Historical Provider, MD  metFORMIN (GLUCOPHAGE-XR) 500 MG 24 hr tablet TAKE 2 TABLETS (1000MG) BY MOUTH EVERY DAY WITH SUPPER 09/16/13  Yes Leandrew Koyanagi, MD  triamcinolone cream (KENALOG) 0.1 % Apply 1 application topically 2 (two) times daily. Triamcinolone with Eucerin 1:1 ratio 10/28/13  Yes Chelle S Jeffery, PA-C  ketoconazole (NIZORAL) 2 % cream Apply topically daily. 09/16/13   Leandrew Koyanagi, MD   Eye exam current  Review of Systems  HENT: Positive for nosebleeds.   Allergic/Immunologic: Positive for environmental allergies (Seasonal allergies).  All other systems reviewed and are negative.     Objective:   Physical Exam  Constitutional: He is oriented to person, place, and time. He appears well-developed and well-nourished.  Wt Readings from Last 3 Encounters: 09/11/14 : 214 lb 3.2 oz (97.16 kg) 09/16/13 : 221 lb 3.2 oz (100.336 kg) 02/18/13 : 222 lb (100.699 kg)   HENT:  Head: Normocephalic.  Right Ear: External ear normal.  Left Ear: External ear normal.  Nose: Nose normal.  Mouth/Throat: Oropharynx is clear and moist.  Tms and canals clear  Eyes: Conjunctivae and EOM are normal. Pupils are equal, round, and reactive to light.  Neck: Normal range of motion. Neck supple. No thyromegaly present.  Cardiovascular: Normal rate, regular rhythm, normal heart sounds and intact distal pulses.   No murmur heard. Pulmonary/Chest: Effort normal and breath sounds normal. No respiratory distress. He has no wheezes. He has  no rales.  Abdominal: Soft. Bowel sounds are normal. He exhibits no distension and no mass. There is no tenderness. There is no rebound and no guarding.  No hepatosplenomegaly  Musculoskeletal: Normal range of motion. He exhibits no edema or tenderness.  Lymphadenopathy:    He has no cervical adenopathy.  Neurological: He is alert and oriented to person, place, and time. He has normal reflexes. No cranial nerve deficit. He exhibits normal muscle tone.  Coordination normal.  Skin: Skin is warm and dry. No rash noted.  Psychiatric: He has a normal mood and affect. His behavior is normal. Judgment and thought content normal.      Assessment & Plan:   Patient informed of risks of pneumonia >28 years old with DM, and recommended to obtain pneumonia vaccine. Annual physical exam - Plan: POCT glycosylated hemoglobin (Hb A1C), CBC, Comprehensive metabolic panel, Lipid panel, PSA, Microalbumin, urine  Hyperlipidemia  Type 2 diabetes mellitus without complication - Plan: POCT glycosylated hemoglobin (Hb A1C), CBC, Comprehensive metabolic panel, Lipid panel, Microalbumin, urine  Screening PSA (prostate specific antigen) - Plan: PSA  I have completed the patient encounter in its entirety as documented by the scribe, with editing by me where necessary. Waneta Fitting P. Laney Pastor, M.D.

## 2014-09-12 LAB — MICROALBUMIN, URINE

## 2014-09-12 LAB — PSA: PSA: 2.06 ng/mL (ref <=4.00)

## 2014-09-17 ENCOUNTER — Encounter: Payer: Self-pay | Admitting: Internal Medicine

## 2014-09-18 ENCOUNTER — Encounter: Payer: Self-pay | Admitting: Internal Medicine

## 2014-09-18 MED ORDER — ZOSTER VACCINE LIVE 19400 UNT/0.65ML ~~LOC~~ SOLR: 0.6500 mL | Freq: Once | SUBCUTANEOUS | Status: DC

## 2014-09-18 NOTE — Telephone Encounter (Signed)
Pt was here for CPE in 09/01/2014.  Pt did not get refills on medication- advised to rtn in one year. Labs have resulted. Please advise one year refill same medications and shingles vaccine

## 2014-09-22 ENCOUNTER — Other Ambulatory Visit: Payer: Self-pay | Admitting: Internal Medicine

## 2014-12-16 ENCOUNTER — Other Ambulatory Visit: Payer: Self-pay

## 2014-12-16 DIAGNOSIS — E119 Type 2 diabetes mellitus without complications: Secondary | ICD-10-CM

## 2014-12-16 MED ORDER — GLUCOSE BLOOD VI STRP: ORAL_STRIP | Status: AC

## 2015-04-10 ENCOUNTER — Ambulatory Visit (INDEPENDENT_AMBULATORY_CARE_PROVIDER_SITE_OTHER): Payer: 59 | Admitting: Family Medicine

## 2015-04-10 ENCOUNTER — Ambulatory Visit (INDEPENDENT_AMBULATORY_CARE_PROVIDER_SITE_OTHER): Payer: 59

## 2015-04-10 VITALS — BP 126/78 | HR 89 | Temp 98.4°F | Ht 68.25 in | Wt 204.4 lb

## 2015-04-10 DIAGNOSIS — E119 Type 2 diabetes mellitus without complications: Secondary | ICD-10-CM

## 2015-04-10 DIAGNOSIS — R1031 Right lower quadrant pain: Secondary | ICD-10-CM | POA: Diagnosis not present

## 2015-04-10 LAB — POCT CBC
GRANULOCYTE PERCENT: 53.9 % (ref 37–80)
HEMATOCRIT: 45.6 % (ref 43.5–53.7)
HEMOGLOBIN: 15.4 g/dL (ref 14.1–18.1)
LYMPH, POC: 2.5 (ref 0.6–3.4)
MCH: 31.1 pg (ref 27–31.2)
MCHC: 33.8 g/dL (ref 31.8–35.4)
MCV: 92 fL (ref 80–97)
MID (CBC): 0.8 (ref 0–0.9)
MPV: 6.5 fL (ref 0–99.8)
POC Granulocyte: 3.8 (ref 2–6.9)
POC LYMPH PERCENT: 34.9 %L (ref 10–50)
POC MID %: 11.2 % (ref 0–12)
Platelet Count, POC: 285 10*3/uL (ref 142–424)
RBC: 4.96 M/uL (ref 4.69–6.13)
RDW, POC: 13.4 %
WBC: 7.1 10*3/uL (ref 4.6–10.2)

## 2015-04-10 LAB — POCT UA - MICROSCOPIC ONLY
Bacteria, U Microscopic: NEGATIVE
Crystals, Ur, HPF, POC: NEGATIVE
EPITHELIAL CELLS, URINE PER MICROSCOPY: NEGATIVE
MUCUS UA: NEGATIVE
RBC, urine, microscopic: NEGATIVE
WBC, UR, HPF, POC: NEGATIVE

## 2015-04-10 LAB — POCT URINALYSIS DIPSTICK
BILIRUBIN UA: NEGATIVE
Blood, UA: NEGATIVE
Glucose, UA: NEGATIVE
Ketones, UA: NEGATIVE
LEUKOCYTES UA: NEGATIVE
Nitrite, UA: NEGATIVE
PROTEIN UA: NEGATIVE
Spec Grav, UA: 1.015
UROBILINOGEN UA: 0.2
pH, UA: 6

## 2015-04-10 LAB — IFOBT (OCCULT BLOOD): IMMUNOLOGICAL FECAL OCCULT BLOOD TEST: NEGATIVE

## 2015-04-10 LAB — GLUCOSE, POCT (MANUAL RESULT ENTRY): POC GLUCOSE: 152 mg/dL — AB (ref 70–99)

## 2015-04-10 LAB — POCT GLYCOSYLATED HEMOGLOBIN (HGB A1C): Hemoglobin A1C: 6.8

## 2015-04-10 NOTE — Progress Notes (Signed)
Subjective:  Patient ID: Steve Hall, male    DOB: [birth year]  Age: 59 y.o. MRN: [id number]  59 year old patient of Dr. Netta Corrigan who has a history of diabetes. He has been working hard on weight loss over the last couple of years, having lost over 50 pounds. He goes to the gym regularly. The last day or so he's been having pain in the right lower quadrant of his abdomen. It is fairly point specific in the McBurney's area. He does not recall a specific straining event. He has not had nausea or vomiting. No fever. He just hurts. This morning he was standing in his kitchen it hit him much more severely, for a transient acute pain.   Objective:   Healthy-appearing man in no major distress at this time. Chest clear. Heart regular. Abdomen has bowel sounds present. Soft without organomegaly or masses. He is tender around the McBurney's point and just below it and lateral to it. No tenderness on percussion. Normal male external genitalia without hernia. Digital rectal exam reveals prostate gland to be large normal in size with no tenderness or masses.  Assessment & Plan:   Assessment: Right lower quadrant abdominal pain etiology undetermined Type 2 diabetes mellitus Weight loss by exercise and eating less   Plan: [facility] reading (PRIMARY) by  Dr. Alwyn Ren No free air or significant air-fluid levels. Nonspecific gas stool pattern. Heavy stool burden.  Results for orders placed or performed in visit on 04/10/15  IFOBT POC (occult bld, rslt in office)  Result Value Ref Range   IFOBT Negative   POCT CBC  Result Value Ref Range   WBC 7.1 4.6 - 10.2 K/uL   Lymph, poc 2.5 0.6 - 3.4   POC LYMPH PERCENT 34.9 10 - 50 %L   MID (cbc) 0.8 0 - 0.9   POC MID % 11.2 0 - 12 %M   POC Granulocyte 3.8 2 - 6.9   Granulocyte percent 53.9 37 - 80 %G   RBC 4.96 4.69 - 6.13 M/uL   Hemoglobin 15.4 14.1 - 18.1 g/dL   HCT, POC 71.0 62.6 - 53.7 %   MCV 92.0 80 - 97 fL   MCH, POC 31.1 27 - 31.2 pg   MCHC 33.8 31.8  - 35.4 g/dL   RDW, POC 94.8 %   Platelet Count, POC 285 142 - 424 K/uL   MPV 6.5 0 - 99.8 fL  POCT UA - Microscopic Only  Result Value Ref Range   WBC, Ur, HPF, POC Negative    RBC, urine, microscopic Negative    Bacteria, U Microscopic Negative    Mucus, UA Negative    Epithelial cells, urine per micros Negative    Crystals, Ur, HPF, POC Negative    Casts, Ur, LPF, POC     Yeast, UA    POCT urinalysis dipstick  Result Value Ref Range   Color, UA Light Yellow    Clarity, UA Clear    Glucose, UA Negative    Bilirubin, UA Negative    Ketones, UA Negative    Spec Grav, UA 1.015    Blood, UA Negative    pH, UA 6.0    Protein, UA Negative    Urobilinogen, UA 0.2    Nitrite, UA Negative    Leukocytes, UA Negative   POCT glucose (manual entry)  Result Value Ref Range   POC Glucose 152 (A) 70 - 99 mg/dl  POCT glycosylated hemoglobin (Hb A1C)  Result Value Ref Range   Hemoglobin A1C  6.8    . Will treat symptomatically for constipation. It could be abdominal wall strain, but I suspect the constipation is the main problem.    Patient Instructions  No evidence of an acute abdomen such as appendicitis  You do have a lot of stool back up in your colon, and I suspect that may be the cause of your problem.  Take MiraLAX twice daily today, then once daily until stools are on the loose side, then decrease to every several days as needed  Drink lots of fluids  In the event of acute worsening of the abdominal pain at anytime return or go to the emergency room            Constipation Constipation is when a person has fewer than three bowel movements a week, has difficulty having a bowel movement, or has stools that are dry, hard, or larger than normal. As people grow older, constipation is more common. If you try to fix constipation with medicines that make you have a bowel movement (laxatives), the problem may get worse. Long-term laxative use may cause the muscles of the colon to  become weak. A low-fiber diet, not taking in enough fluids, and taking certain medicines may make constipation worse.  CAUSES   Certain medicines, such as antidepressants, pain medicine, iron supplements, antacids, and water pills.   Certain diseases, such as diabetes, irritable bowel syndrome (IBS), thyroid disease, or depression.   Not drinking enough water.   Not eating enough fiber-rich foods.   Stress or travel.   Lack of physical activity or exercise.   Ignoring the urge to have a bowel movement.   Using laxatives too much.  SIGNS AND SYMPTOMS   Having fewer than three bowel movements a week.   Straining to have a bowel movement.   Having stools that are hard, dry, or larger than normal.   Feeling full or bloated.   Pain in the lower abdomen.   Not feeling relief after having a bowel movement.  DIAGNOSIS  Your health care provider will take a medical history and perform a physical exam. Further testing may be done for severe constipation. Some tests may include:  A barium enema X-ray to examine your rectum, colon, and, sometimes, your small intestine.   A sigmoidoscopy to examine your lower colon.   A colonoscopy to examine your entire colon. TREATMENT  Treatment will depend on the severity of your constipation and what is causing it. Some dietary treatments include drinking more fluids and eating more fiber-rich foods. Lifestyle treatments may include regular exercise. If these diet and lifestyle recommendations do not help, your health care provider may recommend taking over-the-counter laxative medicines to help you have bowel movements. Prescription medicines may be prescribed if over-the-counter medicines do not work.  HOME CARE INSTRUCTIONS   Eat foods that have a lot of fiber, such as fruits, vegetables, whole grains, and beans.  Limit foods high in fat and processed sugars, such as french fries, hamburgers, cookies, candies, and soda.   A  fiber supplement may be added to your diet if you cannot get enough fiber from foods.   Drink enough fluids to keep your urine clear or pale yellow.   Exercise regularly or as directed by your health care provider.   Go to the restroom when you have the urge to go. Do not hold it.   Only take over-the-counter or prescription medicines as directed by your health care provider. Do not take other medicines for  constipation without talking to your health care provider first.  SEEK IMMEDIATE MEDICAL CARE IF:   You have bright red blood in your stool.   Your constipation lasts for more than 4 days or gets worse.   You have abdominal or rectal pain.   You have thin, pencil-like stools.   You have unexplained weight loss. MAKE SURE YOU:   Understand these instructions.  Will watch your condition.  Will get help right away if you are not doing well or get worse. Document Released: 07/14/2004 Document Revised: 10/21/2013 Document Reviewed: 07/28/2013 [facility] Patient Information 2015 [place], [place]. This information is not intended to replace advice given to you by your health care provider. Make sure you discuss any questions you have with your health care provider.      Courtland Coppa, MD 04/10/2015

## 2015-04-10 NOTE — Patient Instructions (Signed)
No evidence of an acute abdomen such as appendicitis  You do have a lot of stool back up in your colon, and I suspect that may be the cause of your problem.  Take MiraLAX twice daily today, then once daily until stools are on the loose side, then decrease to every several days as needed  Drink lots of fluids  In the event of acute worsening of the abdominal pain at anytime return or go to the emergency room            Constipation Constipation is when a person has fewer than three bowel movements a week, has difficulty having a bowel movement, or has stools that are dry, hard, or larger than normal. As people grow older, constipation is more common. If you try to fix constipation with medicines that make you have a bowel movement (laxatives), the problem may get worse. Long-term laxative use may cause the muscles of the colon to become weak. A low-fiber diet, not taking in enough fluids, and taking certain medicines may make constipation worse.  CAUSES   Certain medicines, such as antidepressants, pain medicine, iron supplements, antacids, and water pills.   Certain diseases, such as diabetes, irritable bowel syndrome (IBS), thyroid disease, or depression.   Not drinking enough water.   Not eating enough fiber-rich foods.   Stress or travel.   Lack of physical activity or exercise.   Ignoring the urge to have a bowel movement.   Using laxatives too much.  SIGNS AND SYMPTOMS   Having fewer than three bowel movements a week.   Straining to have a bowel movement.   Having stools that are hard, dry, or larger than normal.   Feeling full or bloated.   Pain in the lower abdomen.   Not feeling relief after having a bowel movement.  DIAGNOSIS  Your health care provider will take a medical history and perform a physical exam. Further testing may be done for severe constipation. Some tests may include:  A barium enema X-ray to examine your rectum, colon, and,  sometimes, your small intestine.   A sigmoidoscopy to examine your lower colon.   A colonoscopy to examine your entire colon. TREATMENT  Treatment will depend on the severity of your constipation and what is causing it. Some dietary treatments include drinking more fluids and eating more fiber-rich foods. Lifestyle treatments may include regular exercise. If these diet and lifestyle recommendations do not help, your health care provider may recommend taking over-the-counter laxative medicines to help you have bowel movements. Prescription medicines may be prescribed if over-the-counter medicines do not work.  HOME CARE INSTRUCTIONS   Eat foods that have a lot of fiber, such as fruits, vegetables, whole grains, and beans.  Limit foods high in fat and processed sugars, such as french fries, hamburgers, cookies, candies, and soda.   A fiber supplement may be added to your diet if you cannot get enough fiber from foods.   Drink enough fluids to keep your urine clear or pale yellow.   Exercise regularly or as directed by your health care provider.   Go to the restroom when you have the urge to go. Do not hold it.   Only take over-the-counter or prescription medicines as directed by your health care provider. Do not take other medicines for constipation without talking to your health care provider first.  SEEK IMMEDIATE MEDICAL CARE IF:   You have bright red blood in your stool.   Your constipation lasts for more  than 4 days or gets worse.   You have abdominal or rectal pain.   You have thin, pencil-like stools.   You have unexplained weight loss. MAKE SURE YOU:   Understand these instructions.  Will watch your condition.  Will get help right away if you are not doing well or get worse. Document Released: 07/14/2004 Document Revised: 10/21/2013 Document Reviewed: 07/28/2013 [facility] Patient Information 2015 [place], [place]. This information is not intended to replace  advice given to you by your health care provider. Make sure you discuss any questions you have with your health care provider.

## 2015-08-31 ENCOUNTER — Other Ambulatory Visit: Payer: Self-pay | Admitting: Internal Medicine

## 2015-09-01 ENCOUNTER — Telehealth: Payer: Self-pay | Admitting: Family Medicine

## 2015-09-01 NOTE — Telephone Encounter (Signed)
Left a message for patient to call and schedule cpe with Dr. Merla Richesoolittle and do a med review about statin therapy and find out if and when and where he has had a diabetic eye exam done.

## 2015-09-03 ENCOUNTER — Telehealth: Payer: Self-pay | Admitting: Family Medicine

## 2015-09-03 NOTE — Telephone Encounter (Signed)
PATIENT RETURNED CALL AND WILL COME INTO [facility] AND HAVE HIS PHYSICAL COMPLETED THERE AROUND November 18.  HE IS AT RISK OF BEING NON COMPLIANT BECAUSE HE DOES NOT WANT TO SEE AN EYE DOCTOR.

## 2015-09-17 ENCOUNTER — Ambulatory Visit (INDEPENDENT_AMBULATORY_CARE_PROVIDER_SITE_OTHER): Payer: 59 | Admitting: Internal Medicine

## 2015-09-17 VITALS — BP 112/74 | HR 87 | Temp 97.5°F | Resp 16 | Ht 68.0 in | Wt 209.0 lb

## 2015-09-17 DIAGNOSIS — R059 Cough, unspecified: Secondary | ICD-10-CM

## 2015-09-17 DIAGNOSIS — E119 Type 2 diabetes mellitus without complications: Secondary | ICD-10-CM

## 2015-09-17 DIAGNOSIS — Z1211 Encounter for screening for malignant neoplasm of colon: Secondary | ICD-10-CM

## 2015-09-17 DIAGNOSIS — R972 Elevated prostate specific antigen [PSA]: Secondary | ICD-10-CM | POA: Diagnosis not present

## 2015-09-17 DIAGNOSIS — E785 Hyperlipidemia, unspecified: Secondary | ICD-10-CM | POA: Diagnosis not present

## 2015-09-17 DIAGNOSIS — J302 Other seasonal allergic rhinitis: Secondary | ICD-10-CM | POA: Diagnosis not present

## 2015-09-17 DIAGNOSIS — Z0000 Encounter for general adult medical examination without abnormal findings: Secondary | ICD-10-CM | POA: Diagnosis not present

## 2015-09-17 DIAGNOSIS — R05 Cough: Secondary | ICD-10-CM | POA: Diagnosis not present

## 2015-09-17 DIAGNOSIS — Z125 Encounter for screening for malignant neoplasm of prostate: Secondary | ICD-10-CM | POA: Diagnosis not present

## 2015-09-17 LAB — CBC WITH DIFFERENTIAL/PLATELET
Basophils Absolute: 0 10*3/uL (ref 0.0–0.1)
Basophils Relative: 0 % (ref 0–1)
EOS ABS: 0.2 10*3/uL (ref 0.0–0.7)
EOS PCT: 3 % (ref 0–5)
HCT: 45.9 % (ref 39.0–52.0)
Hemoglobin: 15.8 g/dL (ref 13.0–17.0)
LYMPHS ABS: 2.2 10*3/uL (ref 0.7–4.0)
LYMPHS PCT: 33 % (ref 12–46)
MCH: 31.7 pg (ref 26.0–34.0)
MCHC: 34.4 g/dL (ref 30.0–36.0)
MCV: 92 fL (ref 78.0–100.0)
MONO ABS: 0.6 10*3/uL (ref 0.1–1.0)
MONOS PCT: 9 % (ref 3–12)
MPV: 9.1 fL (ref 8.6–12.4)
Neutro Abs: 3.7 10*3/uL (ref 1.7–7.7)
Neutrophils Relative %: 55 % (ref 43–77)
Platelets: 250 10*3/uL (ref 150–400)
RBC: 4.99 MIL/uL (ref 4.22–5.81)
RDW: 13.3 % (ref 11.5–15.5)
WBC: 6.8 10*3/uL (ref 4.0–10.5)

## 2015-09-17 LAB — HEMOGLOBIN A1C: HEMOGLOBIN A1C: 7.7 % — AB (ref 4.0–6.0)

## 2015-09-17 LAB — COMPREHENSIVE METABOLIC PANEL
ALBUMIN: 4.5 g/dL (ref 3.6–5.1)
ALK PHOS: 62 U/L (ref 40–115)
ALT: 29 U/L (ref 9–46)
AST: 18 U/L (ref 10–35)
BILIRUBIN TOTAL: 1.1 mg/dL (ref 0.2–1.2)
BUN: 13 mg/dL (ref 7–25)
CALCIUM: 9.4 mg/dL (ref 8.6–10.3)
CO2: 25 mmol/L (ref 20–31)
Chloride: 98 mmol/L (ref 98–110)
Creat: 0.82 mg/dL (ref 0.70–1.25)
Glucose, Bld: 172 mg/dL — ABNORMAL HIGH (ref 65–99)
POTASSIUM: 4.8 mmol/L (ref 3.5–5.3)
Sodium: 134 mmol/L — ABNORMAL LOW (ref 135–146)
TOTAL PROTEIN: 7.1 g/dL (ref 6.1–8.1)

## 2015-09-17 LAB — LIPID PANEL
CHOL/HDL RATIO: 5 ratio (ref <=5.0)
CHOLESTEROL: 199 mg/dL (ref 125–200)
HDL: 40 mg/dL (ref >=40)
LDL Cholesterol: 128 mg/dL (ref <130)
Triglycerides: 155 mg/dL — ABNORMAL HIGH (ref <150)
VLDL: 31 mg/dL — ABNORMAL HIGH (ref <30)

## 2015-09-17 LAB — HEPATITIS C ANTIBODY: HCV Ab: NEGATIVE

## 2015-09-17 LAB — POCT GLYCOSYLATED HEMOGLOBIN (HGB A1C): Hemoglobin A1C: 7.7

## 2015-09-17 MED ORDER — FLUTICASONE PROPIONATE 50 MCG/ACT NA SUSP: NASAL | Status: DC

## 2015-09-17 MED ORDER — LISINOPRIL 2.5 MG PO TABS: 2.5000 mg | ORAL_TABLET | Freq: Every day | ORAL | Status: DC

## 2015-09-17 MED ORDER — ZOSTER VACCINE LIVE 19400 UNT/0.65ML ~~LOC~~ SOLR: 0.6500 mL | Freq: Once | SUBCUTANEOUS | Status: DC

## 2015-09-17 MED ORDER — METFORMIN HCL ER 500 MG PO TB24: ORAL_TABLET | ORAL | Status: DC

## 2015-09-17 NOTE — Progress Notes (Addendum)
Subjective:  This chart was scribed for [name], MD by Thea Alken, ED Scribe. This patient was seen in room 8 and the patient's care was started at 9:41 AM.   Patient ID: Steve Hall, male    DOB: [birth year], 59 y.o.   MRN: [id number]  HPI   Chief Complaint  Patient presents with  . CPE   HPI Comments: Steve Hall is a 59 y.o. male who presents to the [facility] for a physical.   Last physical was 1 year ago  Health maintenance Colonoscopy- 2006, Dr.Kaplan. Found diverticulosis. Overdue for follow up, was supposed to repeat in 2010.  Diabetes  Lab Results  Component Value Date   HGBA1C 6.8 04/10/2015   He has not been checking his blood sugar outside of office. He gained weight during the summer but states he has loss some weight during the past month. He goes hiking as well as goes to the gym and for walks. No changes in sensation of feet.  Eye care He has not had an eye exam within the last year.   Immunizations  Immunization History  Administered Date(s) Administered  . DTaP 08/31/2007  . Pneumococcal Polysaccharide-23 09/23/2012  . Zoster 08/31/2011   Cough  He's also had a productive cough for 1 month. Cough does not wake him up at night. He has tried natural tonics and is taking Claritin.He denies chest tightness, CP, night sweats and post nasal drip.    Pt will occasionally have left sided nose bleeds when it is cold and dry. His father has the same problem.   Patient Active Problem List   Diagnosis Date Noted  . DM (diabetes mellitus) ([place]) 02/03/2012  . Allergic rhinitis 02/03/2012  . BMI 33.0-33.9,adult 02/03/2012  . Hyperlipidemia 02/03/2012  . Anxiety 02/03/2012    Allergies  Allergen Reactions  . Statins Hives   Prior to Admission medications   Medication Sig Start Date End Date Taking? Authorizing Provider  Blood Glucose Monitoring Suppl (ONE TOUCH ULTRA SYSTEM KIT) W/DEVICE KIT 1 kit by Does not apply route  once. 09/16/13  Yes Leandrew Koyanagi, MD  glucose blood (ONE TOUCH ULTRA TEST) test strip Test blood sugar daily. Dx code: E11.9 12/16/14  Yes Sarah Alleen Borne, PA-C  lisinopril (PRINIVIL,ZESTRIL) 2.5 MG tablet TAKE 1 TABLET (2.5 MG TOTAL) BY MOUTH DAILY. 09/02/15  Yes Posey Boyer, MD  loratadine (CLARITIN) 10 MG tablet Take 10 mg by mouth daily.   Yes Historical Provider, MD  metFORMIN (GLUCOPHAGE-XR) 500 MG 24 hr tablet Take 2 tablets by mouth every day with supper. PATIENT NEEDS OFFICE VISIT FOR ADDITIONAL REFILLS 09/02/15  Yes Posey Boyer, MD  triamcinolone cream (KENALOG) 0.1 % Apply 1 application topically 2 (two) times daily. Triamcinolone with Eucerin 1:1 ratio 10/28/13  Yes Chelle Jeffery, PA-C  ketoconazole (NIZORAL) 2 % cream Apply topically daily. Patient not taking: Reported on 09/17/2015 09/16/13   Leandrew Koyanagi, MD  zoster vaccine live, PF, (ZOSTAVAX) 33354 UNT/0.65ML injection Inject 19,400 Units into the skin once. Administer at pharmacy Patient not taking: Reported on 04/10/2015 09/18/14   Leandrew Koyanagi, MD   Review of Systems   Objective:   Physical Exam  Constitutional: He is oriented to person, place, and time. He appears well-developed and well-nourished. No distress.  HENT:  Head: Normocephalic and atraumatic.  Right Ear: Hearing, tympanic membrane, external ear and ear canal normal.  Left Ear: Hearing, tympanic membrane, external ear and ear canal normal.  Nose: Nose normal.  Mouth/Throat: Uvula is midline, oropharynx is clear and moist and mucous membranes are normal.  Eyes: Conjunctivae, EOM and lids are normal. Pupils are equal, round, and reactive to light. Right eye exhibits no discharge. Left eye exhibits no discharge. No scleral icterus.  Neck: Trachea normal and normal range of motion. Neck supple. Carotid bruit is not present.  Cardiovascular: Normal rate, regular rhythm, normal heart sounds, intact distal pulses and normal pulses.   No murmur  heard. Pulmonary/Chest: Effort normal and breath sounds normal. No respiratory distress. He has no wheezes. He has no rhonchi. He has no rales.  Abdominal: Soft. Normal appearance and bowel sounds are normal. He exhibits no abdominal bruit. There is no hepatosplenomegaly, splenomegaly or hepatomegaly. There is no tenderness.  Musculoskeletal: Normal range of motion. He exhibits no edema or tenderness.  Lymphadenopathy:       Head (right side): No submental, no submandibular, no tonsillar, no preauricular, no posterior auricular and no occipital adenopathy present.       Head (left side): No submental, no submandibular, no tonsillar, no preauricular, no posterior auricular and no occipital adenopathy present.    He has no cervical adenopathy.  Neurological: He is alert and oriented to person, place, and time. He has normal strength and normal reflexes. No cranial nerve deficit or sensory deficit. Coordination and gait normal.  Skin: Skin is warm, dry and intact. No lesion and no rash noted.  Psychiatric: He has a normal mood and affect. His speech is normal and behavior is normal. Judgment and thought content normal.  Nursing note and vitals reviewed.  Filed Vitals:   09/17/15 0923  BP: 112/74  Pulse: 87  Temp: 97.5 F (36.4 C)  TempSrc: Oral  Resp: 16  Height: '5\' 8"'  (1.727 m)  Weight: 209 lb (94.802 kg)  SpO2: 98%   Results for orders placed or performed in visit on 09/17/15  POCT glycosylated hemoglobin (Hb A1C)  Result Value Ref Range   Hemoglobin A1C 7.7    Assessment & Plan:   1. Annual physical exam   2. Type 2 diabetes mellitus without complication, without long-term current use of insulin (HCC) --now less well controlled at 1000 met daily He wants to try diet plus incr exer 3 months and then recheck rather than incr meds He has eye ex scheduled  3. Hyperlipidemia -opposed to statins  4. Screening PSA (prostate specific antigen)   5. Other seasonal allergic rhinitis   6.  Cough 2 to AR   Orders Placed This Encounter  Procedures  . CBC with Differential/Platelet  . Comprehensive metabolic panel  . Lipid panel  . PSA  . Hepatitis C antibody  . POCT glycosylated hemoglobin (Hb A1C)    Meds ordered this encounter  Medications  . fluticasone (FLONASE) 50 MCG/ACT nasal spray    Sig: 1 spray in nostril twice a day for the next month    Dispense:  16 g    Refill:  6  . zoster vaccine live, PF, (ZOSTAVAX) 32951 UNT/0.65ML injection    Sig: Inject 19,400 Units into the skin once. Administer at pharmacy    Dispense:  1 each    Refill:  0  . lisinopril (PRINIVIL,ZESTRIL) 2.5 MG tablet    Sig: Take 1 tablet (2.5 mg total) by mouth daily.    Dispense:  90 tablet    Refill:  3  . metFORMIN (GLUCOPHAGE-XR) 500 MG 24 hr tablet    Sig: Take 2 tablets by mouth  every day with supper.    Dispense:  180 tablet    Refill:  3  clol 2006--polyps-needs f/u  By signing my name below, I, Raven Small, attest that this documentation has been prepared under the direction and in the presence of [name], MD.  Electronically Signed: Thea Alken, ED Scribe. 09/17/2015. 10:32 AM.  I have completed the patient encounter in its entirety as documented by the scribe, with editing by me where necessary. Tramon Crescenzo P. Laney Pastor, M.D.   Addendum 09/20/2015 labs Lab Results  Component Value Date   PSA 4.89* 09/17/2015   PSA 2.06 09/11/2014   PSA 1.52 09/16/2013   this rate of rise indicates the need for evaluation by urology and I will make that appointment

## 2015-09-18 LAB — PSA: PSA: 4.89 ng/mL — ABNORMAL HIGH (ref <=4.00)

## 2015-09-20 DIAGNOSIS — Z125 Encounter for screening for malignant neoplasm of prostate: Secondary | ICD-10-CM | POA: Insufficient documentation

## 2015-09-20 NOTE — Addendum Note (Signed)
Addended by: Tonye PearsonOLITTLE, Kalis Friese P on: 09/20/2015 11:13 PM   Modules accepted: Orders

## 2015-09-29 ENCOUNTER — Other Ambulatory Visit: Payer: Self-pay | Admitting: Family Medicine

## 2015-09-29 ENCOUNTER — Encounter: Payer: Self-pay | Admitting: Family Medicine

## 2015-10-13 ENCOUNTER — Encounter: Payer: Self-pay | Admitting: Gastroenterology

## 2015-11-02 NOTE — Telephone Encounter (Signed)
ERROR

## 2015-12-01 ENCOUNTER — Ambulatory Visit (AMBULATORY_SURGERY_CENTER): Payer: BLUE CROSS/BLUE SHIELD | Admitting: *Deleted

## 2015-12-01 VITALS — Ht 68.0 in | Wt 209.0 lb

## 2015-12-01 DIAGNOSIS — Z1211 Encounter for screening for malignant neoplasm of colon: Secondary | ICD-10-CM

## 2015-12-01 MED ORDER — NA SULFATE-K SULFATE-MG SULF 17.5-3.13-1.6 GM/177ML PO SOLN: ORAL | Status: DC

## 2015-12-01 NOTE — Progress Notes (Signed)
Patient denies any allergies to eggs or soy. Patient denies any problems with anesthesia/sedation. Patient denies any oxygen use at home and does not take any diet/weight loss medications. Patient declined EMMI education. 

## 2015-12-02 ENCOUNTER — Telehealth: Payer: Self-pay | Admitting: Gastroenterology

## 2015-12-02 DIAGNOSIS — Z1211 Encounter for screening for malignant neoplasm of colon: Secondary | ICD-10-CM

## 2015-12-02 MED ORDER — NA SULFATE-K SULFATE-MG SULF 17.5-3.13-1.6 GM/177ML PO SOLN: ORAL | Status: DC

## 2015-12-02 NOTE — Telephone Encounter (Signed)
Pt says Suprep is too expensive.  Will come to [facility] 4th floor Friday 2/3 to pick up sample.

## 2015-12-15 ENCOUNTER — Encounter: Payer: Self-pay | Admitting: Gastroenterology

## 2015-12-15 ENCOUNTER — Ambulatory Visit (AMBULATORY_SURGERY_CENTER): Payer: BLUE CROSS/BLUE SHIELD | Admitting: Gastroenterology

## 2015-12-15 VITALS — BP 142/75 | HR 70 | Temp 97.3°F | Resp 15 | Ht 68.0 in | Wt 209.0 lb

## 2015-12-15 DIAGNOSIS — Z1211 Encounter for screening for malignant neoplasm of colon: Secondary | ICD-10-CM

## 2015-12-15 MED ORDER — SODIUM CHLORIDE 0.9 % IV SOLN: 500.0000 mL | INTRAVENOUS | Status: DC

## 2015-12-15 NOTE — Patient Instructions (Signed)
Impressions/recommendations:  Diverticulosis (handout given) High Fiber Diet (handout given) Hemorrhoids (handout given)  Repeat colonoscopy in 10 years.  YOU HAD AN ENDOSCOPIC PROCEDURE TODAY AT THE  ENDOSCOPY CENTER:   Refer to the procedure report that was given to you for any specific questions about what was found during the examination.  If the procedure report does not answer your questions, please call your gastroenterologist to clarify.  If you requested that your care partner not be given the details of your procedure findings, then the procedure report has been included in a sealed envelope for you to review at your convenience later.  YOU SHOULD EXPECT: Some feelings of bloating in the abdomen. Passage of more gas than usual.  Walking can help get rid of the air that was put into your GI tract during the procedure and reduce the bloating. If you had a lower endoscopy (such as a colonoscopy or flexible sigmoidoscopy) you may notice spotting of blood in your stool or on the toilet paper. If you underwent a bowel prep for your procedure, you may not have a normal bowel movement for a few days.  Please Note:  You might notice some irritation and congestion in your nose or some drainage.  This is from the oxygen used during your procedure.  There is no need for concern and it should clear up in a day or so.  SYMPTOMS TO REPORT IMMEDIATELY:   Following lower endoscopy (colonoscopy or flexible sigmoidoscopy):  Excessive amounts of blood in the stool  Significant tenderness or worsening of abdominal pains  Swelling of the abdomen that is new, acute  Fever of 100F or higher   Following upper endoscopy (EGD)  Vomiting of blood or coffee ground material  New chest pain or pain under the shoulder blades  Painful or persistently difficult swallowing  New shortness of breath  Fever of 100F or higher  Black, tarry-looking stools  For urgent or emergent issues, a  gastroenterologist can be reached at any hour by calling [phone].   DIET: Your first meal following the procedure should be a small meal and then it is ok to progress to your normal diet. Heavy or fried foods are harder to digest and may make you feel nauseous or bloated.  Likewise, meals heavy in dairy and vegetables can increase bloating.  Drink plenty of fluids but you should avoid alcoholic beverages for 24 hours.  ACTIVITY:  You should plan to take it easy for the rest of today and you should NOT DRIVE or use heavy machinery until tomorrow (because of the sedation medicines used during the test).    FOLLOW UP: Our staff will call the number listed on your records the next business day following your procedure to check on you and address any questions or concerns that you may have regarding the information given to you following your procedure. If we do not reach you, we will leave a message.  However, if you are feeling well and you are not experiencing any problems, there is no need to return our call.  We will assume that you have returned to your regular daily activities without incident.  If any biopsies were taken you will be contacted by phone or by letter within the next 1-3 weeks.  Please call us at [phone] if you have not heard about the biopsies in 3 weeks.    SIGNATURES/CONFIDENTIALITY: You and/or your care partner have signed paperwork which will be entered into your electronic medical record.  These signatures attest to the fact that that the information above on your After Visit Summary has been reviewed and is understood.  Full responsibility of the confidentiality of this discharge information lies with you and/or your care-partner.

## 2015-12-15 NOTE — Progress Notes (Signed)
To pacu vss patent aw report to rn 

## 2015-12-15 NOTE — Op Note (Signed)
[facility] [address] [place], [address]   COLONOSCOPY PROCEDURE REPORT  PATIENT: Steve, Hall  MR#: [id number] BIRTHDATE: January 12, 1956 , 59  yrs. old GENDER: male ENDOSCOPIST: Benancio Deeds, MD REFERRED BY: PROCEDURE DATE:  12/15/2015 PROCEDURE:   Colonoscopy, screening First Screening Colonoscopy - Avg.  risk and is 50 yrs.  old or older - No.  Prior Negative Screening - Now for repeat screening. 10 or more years since last screening  History of Adenoma - Now for follow-up colonoscopy & has been > or = to 3 yrs.  N/A  Polyps removed today? No Recommend repeat exam, <10 yrs? No ASA CLASS:   Class II INDICATIONS:Screening for colonic neoplasia and Colorectal Neoplasm Risk Assessment for this procedure is average risk. MEDICATIONS: Propofol 200 mg IV  DESCRIPTION OF PROCEDURE:   After the risks benefits and alternatives of the procedure were thoroughly explained, informed consent was obtained.  The digital rectal exam revealed no abnormalities of the rectum.   The [id number]  endoscope was introduced through the anus and advanced to the cecum, which was identified by both the appendix and ileocecal valve. No adverse events experienced.   The quality of the prep was adequate  The instrument was then slowly withdrawn as the colon was fully examined. Estimated blood loss is zero unless otherwise noted in this procedure report.    COLON FINDINGS: There was moderate diverticulosis throughout the entire colon, most severe in the left colon.  The remainder of the examined colon was normal.  No polyps or mass lesions appreciated. Retroflexed views revealed internal hemorrhoids. The time to cecum = 3.1 Withdrawal time = 14.5   The scope was withdrawn and the procedure completed. COMPLICATIONS: There were no immediate complications.  ENDOSCOPIC IMPRESSION: Pancolonic diverticulosis Internal hemorrhoids No polyps  RECOMMENDATIONS: Resume  diet Resume medications Repeat colon cancer screening in 10 years  eSigned:  Benancio Deeds, MD 12/15/2015 8:26 AM   cc:  the patient

## 2015-12-16 ENCOUNTER — Telehealth: Payer: Self-pay

## 2015-12-16 NOTE — Telephone Encounter (Signed)
  Follow up Call-  Call back number 12/15/2015  Post procedure Call Back phone  # [phone]  Permission to leave phone message Yes     Patient questions:  Do you have a fever, pain , or abdominal swelling? No. Pain Score  0 *  Have you tolerated food without any problems? Yes.    Have you been able to return to your normal activities? Yes.    Do you have any questions about your discharge instructions: Diet   No. Medications  No. Follow up visit  No.  Do you have questions or concerns about your Care? No.  Actions: * If pain score is 4 or above: No action needed, pain <4.

## 2015-12-16 NOTE — Telephone Encounter (Signed)
  Follow up Call-  Call back number 12/15/2015  Post procedure Call Back phone  # [phone]  Permission to leave phone message Yes     Patient questions:  Do you have a fever, pain , or abdominal swelling? No. Pain Score  0 *  Have you tolerated food without any problems? Yes.    Have you been able to return to your normal activities? Yes.    Do you have any questions about your discharge instructions: Diet   No. Medications  No. Follow up visit  No.  Do you have questions or concerns about your Care? No.  Actions: * If pain score is 4 or above: No action needed, pain <4.   

## 2016-08-08 IMAGING — CR DG ABDOMEN ACUTE W/ 1V CHEST
4 series · 4 of 4 positions shown · non-contrast
Comparison: None.

CLINICAL DATA: Right-sided abdominal pain

EXAM:
DG ABDOMEN ACUTE W/ 1V CHEST

[PA]
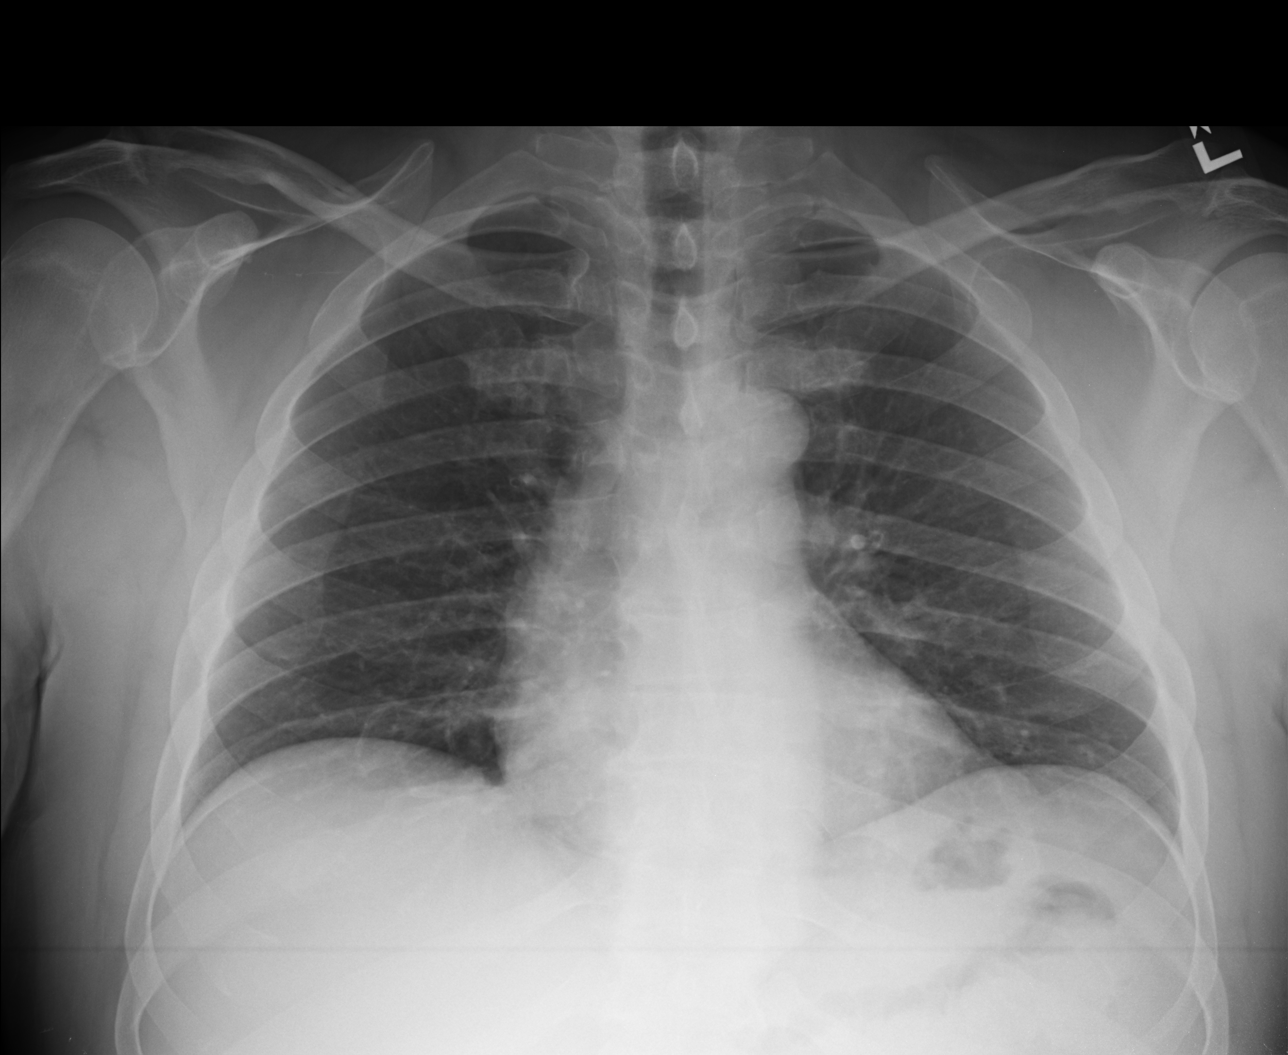

[AP (1 of 3)]
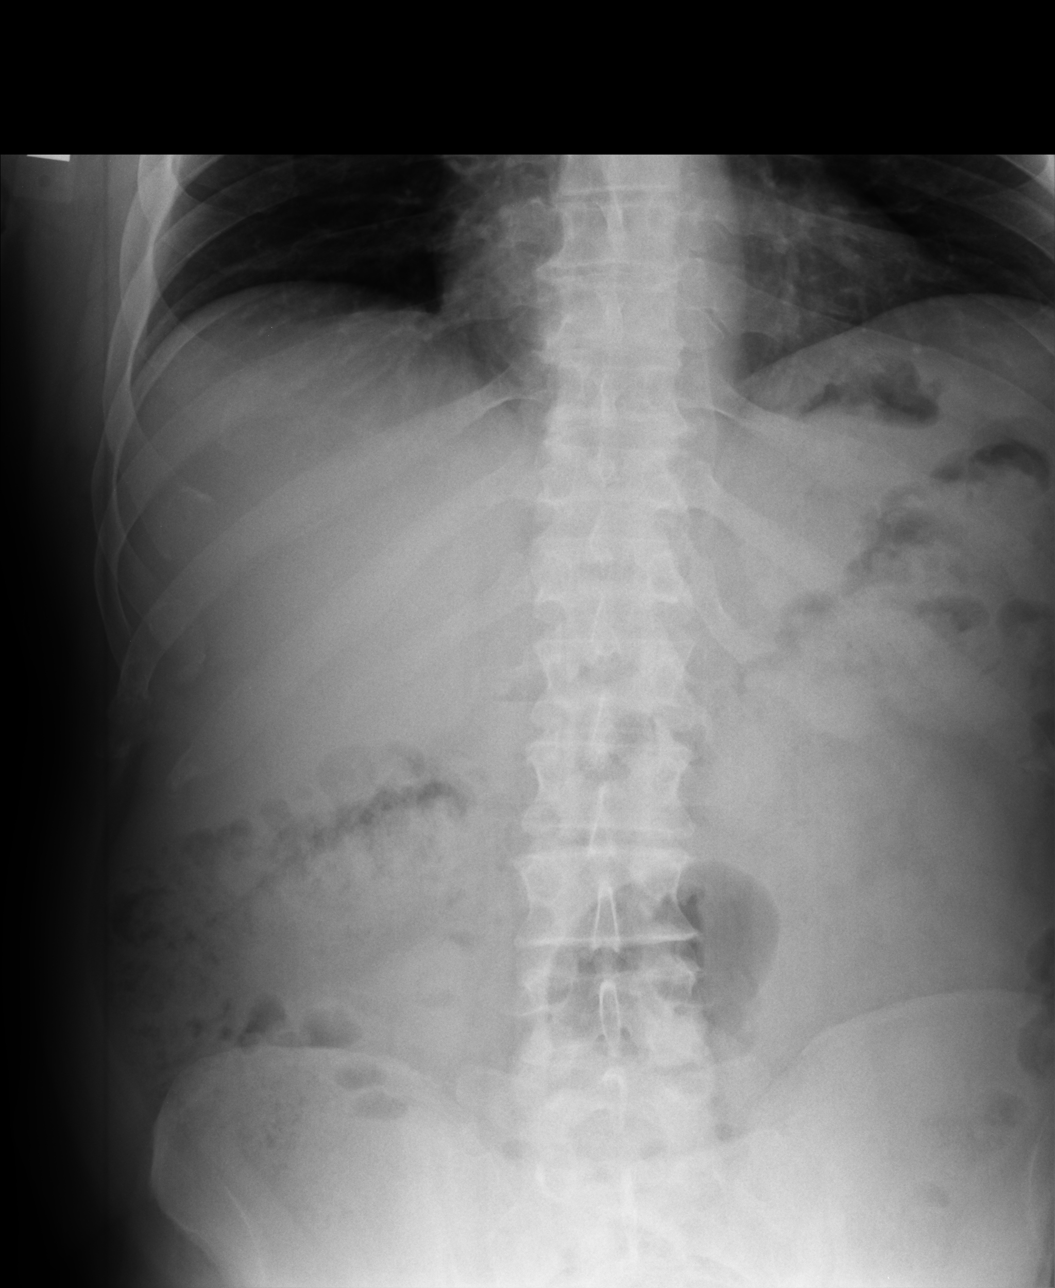

[AP (2 of 3)]
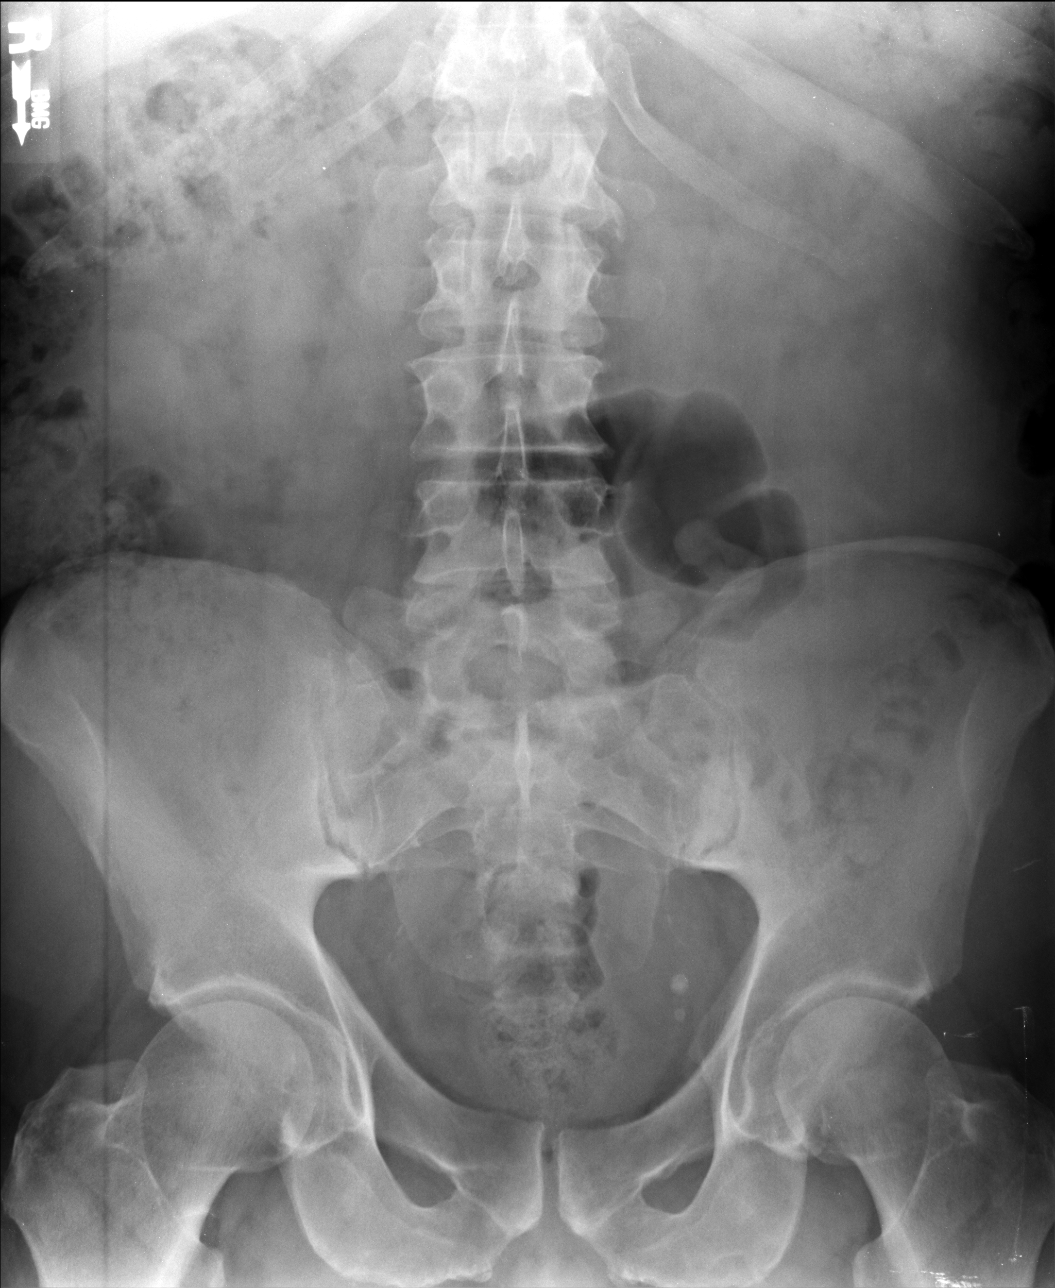

[AP (3 of 3)]
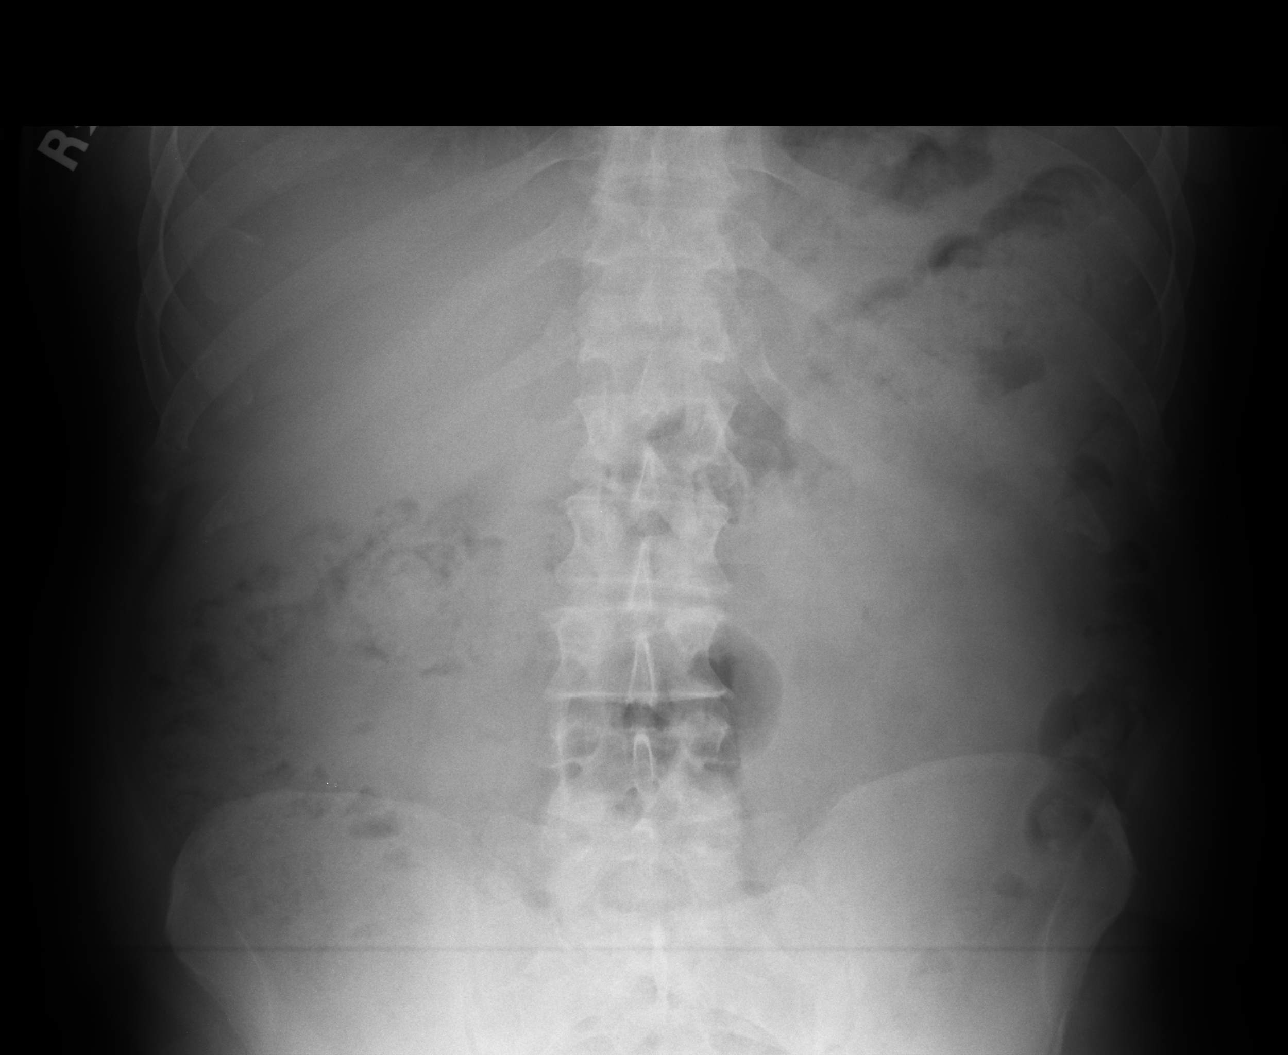

[4 of 4 positions shown; findings below may reference images not displayed]

FINDINGS: Lungs are clear bilaterally. Cardiac shadow is within normal limits.

Scattered large and small bowel gas is noted. Fecal material is
noted throughout the colon. Bony structures are within normal
limits. No free air is seen.
IMPRESSION: No acute abnormality noted.

## 2016-08-18 ENCOUNTER — Other Ambulatory Visit: Payer: Self-pay

## 2016-08-18 MED ORDER — LISINOPRIL 2.5 MG PO TABS: 2.5000 mg | ORAL_TABLET | Freq: Every day | ORAL | 0 refills | Status: DC

## 2016-08-18 NOTE — Telephone Encounter (Signed)
Request for Lisinopril 2.5mg   Sent #30 with note pt is due for OV prior to further refills. Needs to establish with another PCP since [facility] retired.

## 2016-08-22 NOTE — Telephone Encounter (Signed)
°  pharmacist called req. Refill for  lisinopril (PRINIVIL,ZESTRIL) 2.5 MG tablet Relayed prior messaged that OV is required prior to future fills

## 2016-09-19 ENCOUNTER — Ambulatory Visit (INDEPENDENT_AMBULATORY_CARE_PROVIDER_SITE_OTHER): Payer: BLUE CROSS/BLUE SHIELD | Admitting: Family Medicine

## 2016-09-19 VITALS — BP 122/72 | HR 102 | Temp 98.7°F | Resp 17 | Ht 68.5 in | Wt 210.0 lb

## 2016-09-19 DIAGNOSIS — R972 Elevated prostate specific antigen [PSA]: Secondary | ICD-10-CM

## 2016-09-19 DIAGNOSIS — Z0000 Encounter for general adult medical examination without abnormal findings: Secondary | ICD-10-CM

## 2016-09-19 DIAGNOSIS — E785 Hyperlipidemia, unspecified: Secondary | ICD-10-CM

## 2016-09-19 DIAGNOSIS — E119 Type 2 diabetes mellitus without complications: Secondary | ICD-10-CM | POA: Diagnosis not present

## 2016-09-19 LAB — HEMOGLOBIN A1C
HEMOGLOBIN A1C: 9.7 % — AB (ref <5.7)
MEAN PLASMA GLUCOSE: 232 mg/dL

## 2016-09-19 LAB — COMPLETE METABOLIC PANEL WITH GFR
ALT: 59 U/L — AB (ref 9–46)
AST: 31 U/L (ref 10–35)
Albumin: 4.2 g/dL (ref 3.6–5.1)
Alkaline Phosphatase: 66 U/L (ref 40–115)
BILIRUBIN TOTAL: 0.9 mg/dL (ref 0.2–1.2)
BUN: 13 mg/dL (ref 7–25)
CO2: 29 mmol/L (ref 20–31)
CREATININE: 0.76 mg/dL (ref 0.70–1.25)
Calcium: 9.1 mg/dL (ref 8.6–10.3)
Chloride: 95 mmol/L — ABNORMAL LOW (ref 98–110)
GFR, Est African American: >89 mL/min (ref >=60)
GLUCOSE: 248 mg/dL — AB (ref 65–99)
Potassium: 4.4 mmol/L (ref 3.5–5.3)
SODIUM: 133 mmol/L — AB (ref 135–146)
TOTAL PROTEIN: 6.7 g/dL (ref 6.1–8.1)

## 2016-09-19 LAB — LIPID PANEL
Cholesterol: 180 mg/dL (ref <200)
HDL: 29 mg/dL — AB (ref >40)
LDL CALC: 113 mg/dL — AB (ref <100)
Total CHOL/HDL Ratio: 6.2 Ratio — ABNORMAL HIGH (ref <5.0)
Triglycerides: 191 mg/dL — ABNORMAL HIGH (ref <150)
VLDL: 38 mg/dL — ABNORMAL HIGH (ref <30)

## 2016-09-19 LAB — PSA: PSA: 1.4 ng/mL (ref <=4.0)

## 2016-09-19 NOTE — Patient Instructions (Addendum)
Call and obtain eye care provider appointment as soon as possible.  I also recommend dental visit - schedule that as well.  Aspirin once per day.   I am rechecking your PSA today, but if it is not decreased from previous reading, would again recommend evaluation with urologist. I make that referral again if needed.  Continue same medications for now, but if diabetes is still uncontrolled, would recommend some medication changes.   I will let you know once I receive those results. Plan on follow-up in 3 months for diabetes.   Keeping you healthy  Get these tests  Blood pressure- Have your blood pressure checked once a year by your healthcare provider.  Normal blood pressure is 120/80  Weight- Have your body mass index (BMI) calculated to screen for obesity.  BMI is a measure of body fat based on height and weight. You can also calculate your own BMI at View[employer].[email or web address].  Cholesterol- Have your cholesterol checked every year.  Diabetes- Have your blood sugar checked regularly if you have high blood pressure, high cholesterol, have a family history of diabetes or if you are overweight.  Screening for Colon Cancer- Colonoscopy starting at age 38.  Screening may begin sooner depending on your family history and other health conditions. Follow up colonoscopy as directed by your Gastroenterologist.  Screening for Prostate Cancer- Both blood work (PSA) and a rectal exam help screen for Prostate Cancer.  Screening begins at age 56 with African-American men and at age 80 with Caucasian men.  Screening may begin sooner depending on your family history.  Take these medicines  Aspirin- One aspirin daily can help prevent Heart disease and Stroke.  Flu shot- Every fall.  Tetanus- Every 10 years.  Zostavax- Once after the age of 82 to prevent Shingles.  Pneumonia shot- Once after the age of 33; if you are younger than 52, ask your healthcare provider if you need a Pneumonia shot.  Take  these steps  Don't smoke- If you do smoke, talk to your doctor about quitting.  For tips on how to quit, go to www.smokefree.gov or call 1-800-QUIT-NOW.  Be physically active- Exercise 5 days a week for at least 30 minutes.  If you are not already physically active start slow and gradually work up to 30 minutes of moderate physical activity.  Examples of moderate activity include walking briskly, mowing the yard, dancing, swimming, bicycling, etc.  Eat a healthy diet- Eat a variety of healthy food such as fruits, vegetables, low fat milk, low fat cheese, yogurt, lean meant, poultry, fish, beans, tofu, etc. For more information go to www.thenutritionsource.org  Drink alcohol in moderation- Limit alcohol intake to less than two drinks a day. Never drink and drive.  Dentist- Brush and floss twice daily; visit your dentist twice a year.  Depression- Your emotional health is as important as your physical health. If you're feeling down, or losing interest in things you would normally enjoy please talk to your healthcare provider.  Eye exam- Visit your eye doctor every year.  Safe sex- If you may be exposed to a sexually transmitted infection, use a condom.  Seat belts- Seat belts can save your life; always wear one.  Smoke/Carbon Monoxide detectors- These detectors need to be installed on the appropriate level of your home.  Replace batteries at least once a year.  Skin cancer- When out in the sun, cover up and use sunscreen 15 SPF or higher.  Violence- If anyone is threatening you, please tell your  healthcare provider.  Living Will/ Health care power of attorney- Speak with your healthcare provider and family.   Diabetes Mellitus and Standards of Medical Care Managing diabetes (diabetes mellitus) can be complicated. Your diabetes treatment may be managed by a team of health care providers, including:  A diet and nutrition specialist (registered dietitian).  A nurse.  A certified  diabetes educator (CDE).  A diabetes specialist (endocrinologist).  An eye doctor.  A primary care provider.  A dentist. Your health care providers follow a schedule in order to help you get the best quality of care. The following schedule is a general guideline for your diabetes management plan. Your health care providers may also give you more specific instructions. HbA1c ( hemoglobin A1c) test This test provides information about blood sugar (glucose) control over the previous 2-3 months. It is used to check whether your diabetes management plan needs to be adjusted.  If you are meeting your treatment goals, this test is done at least 2 times a year.  If you are not meeting treatment goals or if your treatment goals have changed, this test is done 4 times a year. Blood pressure test  This test is done at every routine medical visit. For most people, the goal is less than 140/90. In some cases, your goal blood pressure may be 130/80 or less. Ask your health care provider what your goal blood pressure should be. Dental and eye exams  Visit your dentist two times a year.  If you have type 1 diabetes, get an eye exam 3-5 years after you are diagnosed, and then once a year after your first exam.  If you were diagnosed with type 1 diabetes as a child, get an eye exam when you are age 23 or older and have had diabetes for 3-5 years. After the first exam, you should get an eye exam once a year.  If you have type 2 diabetes, have an eye exam as soon as you are diagnosed, and then once a year after your first exam. Foot care exam  Visual foot exams are done at every routine medical visit. The exams check for cuts, bruises, redness, blisters, sores, or other problems with the feet.  A complete foot exam is done by your health care provider once a year. This exam includes an inspection of the structure and skin of your feet, and a check of the pulses and sensation in your feet.  Type 1  diabetes: Get your first exam 3-5 years after diagnosis.  Type 2 diabetes: Get your first exam as soon as you are diagnosed.  Check your feet every day for cuts, bruises, redness, blisters, or sores. If you have any of these or other problems that are not healing, contact your health care provider. Kidney function test ( urine microalbumin)  This test is done once a year.  Type 1 diabetes: Get your first test 5 years after diagnosis.  Type 2 diabetes: Get your first test as soon as you are diagnosed.  If you have chronic kidney disease (CKD), get a serum creatinine and estimated glomerular filtration rate (eGFR) test once a year. Lipid profile (cholesterol, HDL, LDL, triglycerides)  This test should be done when you are diagnosed with diabetes, and every 5 years after the first test. If you are on medicines to lower your cholesterol, you may need to get this test done every year.  The goal for LDL is less than 100 mg/dL (5.5 mmol/L). If you are at high  risk, the goal is less than 70 mg/dL (3.9 mmol/L).  The goal for HDL is 40 mg/dL (2.2 mmol/L) for men and 50 mg/dL(2.8 mmol/L) for women. An HDL cholesterol of 60 mg/dL (3.3 mmol/L) or higher gives some protection against heart disease.  The goal for triglycerides is less than 150 mg/dL (8.3 mmol/L). Immunizations  The yearly flu (influenza) vaccine is recommended for everyone 6 months or older who has diabetes.  The pneumonia (pneumococcal) vaccine is recommended for everyone 2 years or older who has diabetes. If you are 43 or older, you may get the pneumonia vaccine as a series of two separate shots.  The hepatitis B vaccine is recommended for adults shortly after they have been diagnosed with diabetes.  The Tdap (tetanus, diphtheria, and pertussis) vaccine should be given:  According to normal childhood vaccination schedules, for children.  Every 10 years, for adults who have diabetes.  The shingles vaccine is recommended for  people who have had chicken pox and are 50 years or older. Mental and emotional health  Screening for symptoms of eating disorders, anxiety, and depression is recommended at the time of diagnosis and afterward as needed. If your screening shows that you have symptoms (you have a positive screening result), you may need further evaluation and be referred to a mental health care provider. Diabetes self-management education  Education about how to manage your diabetes is recommended at diagnosis and ongoing as needed. Treatment plan  Your treatment plan will be reviewed at every medical visit. Summary  Managing diabetes (diabetes mellitus) can be complicated. Your diabetes treatment may be managed by a team of health care providers.  Your health care providers follow a schedule in order to help you get the best quality of care.  Standards of care including having regular physical exams, blood tests, blood pressure monitoring, immunizations, screening tests, and education about how to manage your diabetes.  Your health care providers may also give you more specific instructions based on your individual health. This information is not intended to replace advice given to you by your health care provider. Make sure you discuss any questions you have with your health care provider. Document Released: 08/13/2009 Document Revised: 07/14/2016 Document Reviewed: 07/14/2016 Elsevier Interactive Patient Education  2017 [employer].     IF you received an x-ray today, you will receive an invoice from [facility]. Please contact [facility] at [phone] with questions or concerns regarding your invoice.   IF you received labwork today, you will receive an invoice from [employer]. Please contact [facility] at [phone] with questions or concerns regarding your invoice.   Our billing staff will not be able to assist you with questions regarding bills  from these companies.  You will be contacted with the lab results as soon as they are available. The fastest way to get your results is to activate your My Chart account. Instructions are located on the last page of this paperwork. If you have not heard from [place] regarding the results in 2 weeks, please contact this office.

## 2016-09-19 NOTE — Progress Notes (Signed)
By signing my name below, I, Mesha Guinyard, attest that this documentation has been prepared under the direction and in the presence of [name], MD.  Electronically Signed: Verlee Monte, Medical Scribe. 09/19/16. 11:16 AM.  Subjective:    Patient ID: Steve Hall, male    DOB: 1956-03-01, 60 y.o.   MRN: [id number]  HPI Chief Complaint  Patient presents with  . Annual Exam    HPI Comments: Steve Hall is a 60 y.o. male with a PMHx of who presents to the [facility] for complete physical. Hx of HLD, DM, allergic rhinitis. Prev pt of Dr. Laney Pastor. Last physical 08/2015. Discontinued taking Lisinopril 2.9m.  DM: Last office visit for DM was 08/2015, not cntrolled and planned for diet and exercise. Takes metformin 5043mBID at night. Complaint with metformin. Denies complications from his DM such as numbness or tingling in his hands or feet, and other symptoms. Lab Results  Component Value Date   HGBA1C 7.7 09/17/2015   Lab Results  Component Value Date   MICROALBUR <0.2 09/11/2014   Wt Readings from Last 3 Encounters:  09/19/16 210 lb (95.3 kg)  12/15/15 209 lb (94.8 kg)  12/01/15 209 lb (94.8 kg)   HLD: Not on medications for cholesterol. Reports having rash after taking a statin in the past. Lab Results  Component Value Date   CHOL 199 09/17/2015   HDL 40 09/17/2015   LDLCALC 128 09/17/2015   TRIG 155 (H) 09/17/2015   CHOLHDL 5.0 09/17/2015   Lab Results  Component Value Date   ALT 29 09/17/2015   AST 18 09/17/2015   ALKPHOS 62 09/17/2015   BILITOT 1.1 09/17/2015   Allergic Rhinitis: Used claritin 1063mn the past. Continues to take claritin for his symptoms.  Cancer Screening: Colon CA: Colonoscopy 12/2015; diverticulosis, internal hemorrhoids, and repeat in 10 years. Prostate CA: Inc from prev reading of 2.06. He was referred to urology. Didn't go to the urologist for his elevated PSA. Reports nocturia 1-2x and has been stable  over the past year.   Lab Results  Component Value Date   PSA 4.89 (H) 09/17/2015   PSA 2.06 09/11/2014   PSA 1.52 09/16/2013   Immunzations: Received his flu shot this year. Immunization History  Administered Date(s) Administered  . DTaP 08/31/2007  . Pneumococcal Polysaccharide-23 09/23/2012  . Zoster 08/31/2011   Vision: Hasn't seen a ophthalmologist in the past year. Denies vision changes.  Visual Acuity Screening   Right eye Left eye Both eyes  Without correction: _0  With correction:      Dentist: Hasn't seen a dentist in the past 6 months.  Exercise: Exercises in the gym 3x a week and hikes 5 miles 3x a week.  [facility] Screening[facility]2016 09/17/2015 04/10/2015  Decreased Interest 0 0 0  Down, Depressed, Hopeless 0 0 0  PHQ - 2 Score 0 0 0   Hep C Screening: No Hep C antibodies found during 09/17/15 screening  Patient Active Problem List   Diagnosis Date Noted  . Screening PSA (prostate specific antigen)---elevated at 4.89 09/20/2015  . DM (diabetes mellitus) ([facility]4/03/2012  . Allergic rhinitis 02/03/2012  . BMI 33.0-33.9,adult 02/03/2012  . Hyperlipidemia 02/03/2012  . Anxiety 02/03/2012   Past Medical History:  Diagnosis Date  . Allergy   . Diabetes mellitus without complication (HCCRafael Capo . Hypertension    "BP med to protect kidneys" -per pt.    Past Surgical History:  Procedure Laterality Date  . TONSILLECTOMY  at age 43   Allergies  Allergen Reactions  . Statins Hives   Prior to Admission medications   Medication Sig Start Date End Date Taking? Authorizing Provider  Blood Glucose Monitoring Suppl (ONE TOUCH ULTRA SYSTEM KIT) W/DEVICE KIT 1 kit by Does not apply route once. 09/16/13  Yes Leandrew Koyanagi, MD  glucose blood (ONE TOUCH ULTRA TEST) test strip Test blood sugar daily. Dx code: E11.9 12/16/14  Yes Mancel Bale, PA-C  ketoconazole (NIZORAL) 2 % cream Apply topically daily. 09/16/13  Yes Leandrew Koyanagi, MD  lisinopril (PRINIVIL,ZESTRIL) 2.5 MG tablet Take 1 tablet (2.5 mg total) by mouth daily. 08/18/16  Yes Wardell Honour, MD  loratadine (CLARITIN) 10 MG tablet Take 10 mg by mouth daily. Reported on 12/01/2015   Yes Historical Provider, MD  metFORMIN (GLUCOPHAGE-XR) 500 MG 24 hr tablet Take 2 tablets by mouth every day with supper. 09/17/15  Yes Leandrew Koyanagi, MD  triamcinolone cream (KENALOG) 0.1 % Apply 1 application topically 2 (two) times daily. Triamcinolone with Eucerin 1:1 ratio 10/28/13  Yes Harrison Mons, PA-C   Social History   Social History  . Marital status: Married    Spouse name: N/A  . Number of children: N/A  . Years of education: N/A   Occupational History  . Not on file.   Social History Main Topics  . Smoking status: Former Smoker    Quit date: 02/02/2002  . Smokeless tobacco: Never Used  . Alcohol use Yes     Comment: wine weekly per pt.  . Drug use: No  . Sexual activity: Not on file   Other Topics Concern  . Not on file   Social History Narrative  . No narrative on file   Review of Systems  13 point ROS was negative. Objective:  Physical Exam  Constitutional: He is oriented to person, place, and time. He appears well-developed and well-nourished.  HENT:  Head: Normocephalic and atraumatic.  Right Ear: External ear normal.  Left Ear: External ear normal.  Mouth/Throat: Oropharynx is clear and moist.  Both canals have excess cerumen  Eyes: Conjunctivae and EOM are normal. Pupils are equal, round, and reactive to light.  Neck: Normal range of motion. Neck supple. No thyromegaly present.  Cardiovascular: Normal rate, regular rhythm, normal heart sounds and intact distal pulses.   Pulmonary/Chest: Effort normal and breath sounds normal. No respiratory distress. He has no wheezes.  Abdominal: Soft. He exhibits no distension. There is no tenderness. Hernia confirmed negative in the right inguinal area and confirmed negative in the left  inguinal area.  Genitourinary: Prostate normal.  Musculoskeletal: Normal range of motion. He exhibits no edema or tenderness.  Lymphadenopathy:    He has no cervical adenopathy.  Neurological: He is alert and oriented to person, place, and time. He has normal reflexes.  Skin: Skin is warm and dry.  Psychiatric: He has a normal mood and affect. His behavior is normal.  Vitals reviewed.  BP 122/72 (BP Location: Right Arm, Patient Position: Sitting, Cuff Size: Normal)   Pulse (!) 102   Temp 98.7 F (37.1 C) (Oral)   Resp 17   Ht 5' 8.5" (1.74 m)   Wt 210 lb (95.3 kg)   SpO2 95%   BMI 31.47 kg/m  Assessment & Plan:   Steve Hall is a 60 y.o. male Annual physical exam  --anticipatory guidance as below in AVS, screening labs above. Health maintenance items as  above in HPI discussed/recommended as applicable.   -Advised to schedule ophthalmic and dentist appointments  Type 2 diabetes mellitus without complication, without long-term current use of insulin ([place]) - Plan: Microalbumin, urine, Hemoglobin A1C  -Prior uncontrolled. Check A1c, handout on standards of medical care for diabetes including testing intervals.  Hyperlipidemia, unspecified hyperlipidemia type - Plan: COMPLETE METABOLIC PANEL WITH GFR, Lipid panel  -Check labs. Intolerant to statins in the past, so if elevated cholesterol, can look into possible Zetia.  Elevated PSA - Plan: PSA  -Did not follow-up with urology as planned last visit for increased PSA as well as increased PSA velocity. After discussion office today, did agree to see urology if PSA was again elevated.  No orders of the defined types were placed in this encounter.  Patient Instructions   Call and obtain eye care provider appointment as soon as possible.  I also recommend dental visit - schedule that as well.  Aspirin once per day.   I am rechecking your PSA today, but if it is not decreased from previous reading, would again recommend evaluation  with urologist. I make that referral again if needed.  Continue same medications for now, but if diabetes is still uncontrolled, would recommend some medication changes.   I will let you know once I receive those results. Plan on follow-up in 3 months for diabetes.   Keeping you healthy  Get these tests  Blood pressure- Have your blood pressure checked once a year by your healthcare provider.  Normal blood pressure is 120/80  Weight- Have your body mass index (BMI) calculated to screen for obesity.  BMI is a measure of body fat based on height and weight. You can also calculate your own BMI at ViewBank[employer].si.  Cholesterol- Have your cholesterol checked every year.  Diabetes- Have your blood sugar checked regularly if you have high blood pressure, high cholesterol, have a family history of diabetes or if you are overweight.  Screening for Colon Cancer- Colonoscopy starting at age 80.  Screening may begin sooner depending on your family history and other health conditions. Follow up colonoscopy as directed by your Gastroenterologist.  Screening for Prostate Cancer- Both blood work (PSA) and a rectal exam help screen for Prostate Cancer.  Screening begins at age 35 with African-American men and at age 14 with Caucasian men.  Screening may begin sooner depending on your family history.  Take these medicines  Aspirin- One aspirin daily can help prevent Heart disease and Stroke.  Flu shot- Every fall.  Tetanus- Every 10 years.  Zostavax- Once after the age of 29 to prevent Shingles.  Pneumonia shot- Once after the age of 26; if you are younger than 65, ask your healthcare provider if you need a Pneumonia shot.  Take these steps  Don't smoke- If you do smoke, talk to your doctor about quitting.  For tips on how to quit, go to www.smokefree.gov or call 1-800-QUIT-NOW.  Be physically active- Exercise 5 days a week for at least 30 minutes.  If you are not already physically  active start slow and gradually work up to 30 minutes of moderate physical activity.  Examples of moderate activity include walking briskly, mowing the yard, dancing, swimming, bicycling, etc.  Eat a healthy diet- Eat a variety of healthy food such as fruits, vegetables, low fat milk, low fat cheese, yogurt, lean meant, poultry, fish, beans, tofu, etc. For more information go to www.thenutritionsource.org  Drink alcohol in moderation- Limit alcohol intake to less than two  drinks a day. Never drink and drive.  Dentist- Brush and floss twice daily; visit your dentist twice a year.  Depression- Your emotional health is as important as your physical health. If you're feeling down, or losing interest in things you would normally enjoy please talk to your healthcare provider.  Eye exam- Visit your eye doctor every year.  Safe sex- If you may be exposed to a sexually transmitted infection, use a condom.  Seat belts- Seat belts can save your life; always wear one.  Smoke/Carbon Monoxide detectors- These detectors need to be installed on the appropriate level of your home.  Replace batteries at least once a year.  Skin cancer- When out in the sun, cover up and use sunscreen 15 SPF or higher.  Violence- If anyone is threatening you, please tell your healthcare provider.  Living Will/ Health care power of attorney- Speak with your healthcare provider and family.   Diabetes Mellitus and Standards of Medical Care Managing diabetes (diabetes mellitus) can be complicated. Your diabetes treatment may be managed by a team of health care providers, including:  A diet and nutrition specialist (registered dietitian).  A nurse.  A certified diabetes educator (CDE).  A diabetes specialist (endocrinologist).  An eye doctor.  A primary care provider.  A dentist. Your health care providers follow a schedule in order to help you get the best quality of care. The following schedule is a general  guideline for your diabetes management plan. Your health care providers may also give you more specific instructions. HbA1c ( hemoglobin A1c) test This test provides information about blood sugar (glucose) control over the previous 2-3 months. It is used to check whether your diabetes management plan needs to be adjusted.  If you are meeting your treatment goals, this test is done at least 2 times a year.  If you are not meeting treatment goals or if your treatment goals have changed, this test is done 4 times a year. Blood pressure test  This test is done at every routine medical visit. For most people, the goal is less than 140/90. In some cases, your goal blood pressure may be 130/80 or less. Ask your health care provider what your goal blood pressure should be. Dental and eye exams  Visit your dentist two times a year.  If you have type 1 diabetes, get an eye exam 3-5 years after you are diagnosed, and then once a year after your first exam.  If you were diagnosed with type 1 diabetes as a child, get an eye exam when you are age 36 or older and have had diabetes for 3-5 years. After the first exam, you should get an eye exam once a year.  If you have type 2 diabetes, have an eye exam as soon as you are diagnosed, and then once a year after your first exam. Foot care exam  Visual foot exams are done at every routine medical visit. The exams check for cuts, bruises, redness, blisters, sores, or other problems with the feet.  A complete foot exam is done by your health care provider once a year. This exam includes an inspection of the structure and skin of your feet, and a check of the pulses and sensation in your feet.  Type 1 diabetes: Get your first exam 3-5 years after diagnosis.  Type 2 diabetes: Get your first exam as soon as you are diagnosed.  Check your feet every day for cuts, bruises, redness, blisters, or sores. If you have  any of these or other problems that are not  healing, contact your health care provider. Kidney function test ( urine microalbumin)  This test is done once a year.  Type 1 diabetes: Get your first test 5 years after diagnosis.  Type 2 diabetes: Get your first test as soon as you are diagnosed.  If you have chronic kidney disease (CKD), get a serum creatinine and estimated glomerular filtration rate (eGFR) test once a year. Lipid profile (cholesterol, HDL, LDL, triglycerides)  This test should be done when you are diagnosed with diabetes, and every 5 years after the first test. If you are on medicines to lower your cholesterol, you may need to get this test done every year.  The goal for LDL is less than 100 mg/dL (5.5 mmol/L). If you are at high risk, the goal is less than 70 mg/dL (3.9 mmol/L).  The goal for HDL is 40 mg/dL (2.2 mmol/L) for men and 50 mg/dL(2.8 mmol/L) for women. An HDL cholesterol of 60 mg/dL (3.3 mmol/L) or higher gives some protection against heart disease.  The goal for triglycerides is less than 150 mg/dL (8.3 mmol/L). Immunizations  The yearly flu (influenza) vaccine is recommended for everyone 6 months or older who has diabetes.  The pneumonia (pneumococcal) vaccine is recommended for everyone 2 years or older who has diabetes. If you are 69 or older, you may get the pneumonia vaccine as a series of two separate shots.  The hepatitis B vaccine is recommended for adults shortly after they have been diagnosed with diabetes.  The Tdap (tetanus, diphtheria, and pertussis) vaccine should be given:  According to normal childhood vaccination schedules, for children.  Every 10 years, for adults who have diabetes.  The shingles vaccine is recommended for people who have had chicken pox and are 50 years or older. Mental and emotional health  Screening for symptoms of eating disorders, anxiety, and depression is recommended at the time of diagnosis and afterward as needed. If your screening shows that you have  symptoms (you have a positive screening result), you may need further evaluation and be referred to a mental health care provider. Diabetes self-management education  Education about how to manage your diabetes is recommended at diagnosis and ongoing as needed. Treatment plan  Your treatment plan will be reviewed at every medical visit. Summary  Managing diabetes (diabetes mellitus) can be complicated. Your diabetes treatment may be managed by a team of health care providers.  Your health care providers follow a schedule in order to help you get the best quality of care.  Standards of care including having regular physical exams, blood tests, blood pressure monitoring, immunizations, screening tests, and education about how to manage your diabetes.  Your health care providers may also give you more specific instructions based on your individual health. This information is not intended to replace advice given to you by your health care provider. Make sure you discuss any questions you have with your health care provider. Document Released: 08/13/2009 Document Revised: 07/14/2016 Document Reviewed: 07/14/2016 Elsevier Interactive Patient Education  2017 [employer].     IF you received an x-ray today, you will receive an invoice from [facility]. Please contact [facility] at [phone] with questions or concerns regarding your invoice.   IF you received labwork today, you will receive an invoice from [employer]. Please contact [facility] at [phone] with questions or concerns regarding your invoice.   Our billing staff will not be able to assist  you with questions regarding bills from these companies.  You will be contacted with the lab results as soon as they are available. The fastest way to get your results is to activate your My Chart account. Instructions are located on the last page of this paperwork. If you have not heard from [place]  regarding the results in 2 weeks, please contact this office.        I personally performed the services described in this documentation, which was scribed in my presence. The recorded information has been reviewed and considered, and addended by me as needed.   Signed,   [name], MD [facility].  09/20/16 3:55 PM

## 2016-09-20 LAB — MICROALBUMIN, URINE: MICROALB UR: 0.2 mg/dL

## 2016-09-23 ENCOUNTER — Encounter: Payer: Self-pay | Admitting: Family Medicine

## 2016-09-27 MED ORDER — METFORMIN HCL 1000 MG PO TABS: 1000.0000 mg | ORAL_TABLET | Freq: Two times a day (BID) | ORAL | 1 refills | Status: DC

## 2016-09-27 NOTE — Telephone Encounter (Signed)
I didn't see the results reviewed yet so will forward to you to answer afterward.

## 2016-09-27 NOTE — Addendum Note (Signed)
Addended by: Meredith StaggersGREENE, Stepfon Rawles R on: 09/27/2016 04:35 PM   Modules accepted: Orders

## 2016-09-27 NOTE — Telephone Encounter (Signed)
See lab notes

## 2016-09-28 ENCOUNTER — Encounter: Payer: Self-pay | Admitting: Family Medicine

## 2017-03-23 ENCOUNTER — Other Ambulatory Visit: Payer: Self-pay | Admitting: Family Medicine

## 2017-04-23 ENCOUNTER — Other Ambulatory Visit: Payer: Self-pay | Admitting: Family Medicine

## 2017-04-26 ENCOUNTER — Encounter: Payer: Self-pay | Admitting: Family Medicine

## 2017-04-26 ENCOUNTER — Ambulatory Visit (INDEPENDENT_AMBULATORY_CARE_PROVIDER_SITE_OTHER): Payer: BLUE CROSS/BLUE SHIELD | Admitting: Family Medicine

## 2017-04-26 VITALS — BP 148/82 | HR 107 | Temp 98.0°F | Resp 18 | Ht 68.9 in | Wt 208.0 lb

## 2017-04-26 DIAGNOSIS — R1031 Right lower quadrant pain: Secondary | ICD-10-CM | POA: Diagnosis not present

## 2017-04-26 DIAGNOSIS — E785 Hyperlipidemia, unspecified: Secondary | ICD-10-CM

## 2017-04-26 DIAGNOSIS — M25551 Pain in right hip: Secondary | ICD-10-CM | POA: Diagnosis not present

## 2017-04-26 DIAGNOSIS — E1165 Type 2 diabetes mellitus with hyperglycemia: Secondary | ICD-10-CM | POA: Diagnosis not present

## 2017-04-26 MED ORDER — METFORMIN HCL 1000 MG PO TABS: 1000.0000 mg | ORAL_TABLET | Freq: Two times a day (BID) | ORAL | 1 refills | Status: DC

## 2017-04-26 MED ORDER — TRAMADOL HCL 50 MG PO TABS: 50.0000 mg | ORAL_TABLET | Freq: Three times a day (TID) | ORAL | 0 refills | Status: DC | PRN

## 2017-04-26 NOTE — Progress Notes (Signed)
Subjective:  By signing my name below, I, Steve Hall, attest that this documentation has been prepared under the direction and in the presence of Steve Ray, MD. Electronically Signed: Moises Hall, [place]. 04/26/2017 , 12:26 PM .  Patient was seen in Room 27 .   Patient ID: Steve Hall, male    DOB: [birth year], 61 y.o.   MRN: [id number] Chief Complaint  Patient presents with  . Hypertension    follow-up  . Diabetes  . Hip Injury     hit right lower hip about a month ago and still having pain    HPI Steve Hall is a 61 y.o. male He was last seen in Nov for a physical. He is not fasting today, but agrees to return for fasting labs.   Over 30 minutes of face-to-face care with repeating history.   HTN He's taken lisinopril in the past. He believes his elevated BP is due to his right hip pain.    DM Lab Results  Component Value Date   HGBA1C 9.7 (H) 09/19/2016   Lab Results  Component Value Date   MICROALBUR 0.2 09/19/2016   He was taking metformin 1024m QD at that time. He was recommended to increase to metformin 10049mBID, and recommended visit in 3 months; sooner if persistent elevated readings. He had a follow up e-mail from last visit indicating for plans for returning to previous exercise and diet changes.   He states he's been doing 5 mile hikes recently. He's maybe missed a couple doses of his metformin because he goes to work at 4:00AM.   Wt Readings from Last 3 Encounters:  04/26/17 208 lb (94.3 kg)  09/19/16 210 lb (95.3 kg)  12/15/15 209 lb (94.8 kg)   Ophtha: He hasn't seen his eye doctor recently. He plans to schedule an appointment soon.  Dentist: He is due to be seen by dentist.   Hyperlipidemia Lab Results  Component Value Date   CHOL 180 09/19/2016   HDL 29 (L) 09/19/2016   LDLCALC 113 (H) 09/19/2016   TRIG 191 (H) 09/19/2016   CHOLHDL 6.2 (H) 09/19/2016   Lab Results  Component Value Date   ALT 59 (H) 09/19/2016   AST 31  09/19/2016   ALKPHOS 66 09/19/2016   BILITOT 0.9 09/19/2016   His triglycerides were elevated, thought in part from hyperglycemia. His ALT was elevated and plan for recheck at follow up.   He states he was started on statins in the past, but he broke out in hives all over.    Right hip pain He reports running into a bar at the end of a stairwell while at work over a month ago (around May 12th). He states that it almost caused him to blackout due to how hard hit it. He denies bruising. He was initially just dealing with the pain, but it started to become debilitating. He notes that his pain has gotten worse over past 3 weeks and didn't file an incident until about 3 weeks ago.   He started taking naproxen every 8 hours, but it wasn't giving him relief. He then added ibuprofen 40066ms well. He's increased his frequency of medications to naproxen and 2 tablets of ibuprofen (400m30mvery 5 hours. He denies abdominal pain, dark or tarry stools, or Hall in stool. He denies swelling in the area, radicular pain, hernia or penile discharge.   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   Patient Active Problem List   Diagnosis Date Noted  . Screening PSA (prostate specific antigen)---elevated at 4.89 09/20/2015  . DM (diabetes mellitus) ([place]) 02/03/2012  . Allergic rhinitis 02/03/2012  . BMI 33.0-33.9,adult 02/03/2012  . Hyperlipidemia 02/03/2012  . Anxiety 02/03/2012   Past Medical History:  Diagnosis Date  . Allergy   . Diabetes mellitus without complication ([place])   . Hypertension    "BP med to protect kidneys" -per pt.    Past Surgical History:  Procedure Laterality Date  . TONSILLECTOMY  at age 80   Allergies  Allergen Reactions  . Statins Hives   Prior to Admission medications   Medication Sig Start Date End Date Taking? Authorizing Provider  Hall Glucose Monitoring Suppl (ONE TOUCH ULTRA SYSTEM KIT) W/DEVICE KIT 1 kit by  Does not apply route once. 09/16/13  Yes Leandrew Koyanagi, MD  glucose Hall (ONE TOUCH ULTRA TEST) test strip Test Hall sugar daily. Dx code: E11.9 12/16/14  Yes Weber, Damaris Hippo, PA-C  ketoconazole (NIZORAL) 2 % cream Apply topically daily. 09/16/13  Yes Leandrew Koyanagi, MD  loratadine (CLARITIN) 10 MG tablet Take 10 mg by mouth daily. Reported on 12/01/2015   Yes [provider]  metFORMIN (GLUCOPHAGE) 1000 MG tablet TAKE 1 TABLET (1,000 MG TOTAL) BY MOUTH 2 (TWO) TIMES DAILY WITH A MEAL. 03/26/17  Yes Wendie Agreste, MD  triamcinolone cream (KENALOG) 0.1 % Apply 1 application topically 2 (two) times daily. Triamcinolone with Eucerin 1:1 ratio 10/28/13  Yes Harrison Mons, PA-C   Social History   Social History  . Marital status: Married    Spouse name: N/A  . Number of children: N/A  . Years of education: N/A   Occupational History  . Not on file.   Social History Main Topics  . Smoking status: Former Smoker    Quit date: 02/02/2002  . Smokeless tobacco: Never Used  . Alcohol use Yes     Comment: wine weekly per pt.  . Drug use: No  . Sexual activity: Not on file   Other Topics Concern  . Not on file   Social History Narrative  . No narrative on file   Review of Systems  Constitutional: Negative for fatigue and unexpected weight change.  Eyes: Negative for visual disturbance.  Respiratory: Negative for cough, chest tightness and shortness of breath.   Cardiovascular: Negative for chest pain, palpitations and leg swelling.  Gastrointestinal: Negative for abdominal pain and Hall in stool.  Genitourinary: Negative for discharge.  Musculoskeletal: Positive for arthralgias. Negative for gait problem.  Neurological: Negative for dizziness, light-headedness and headaches.       Objective:   Physical Exam  Constitutional: He is oriented to person, place, and time. He appears well-developed and well-nourished.  HENT:  Head: Normocephalic and atraumatic.    Eyes: EOM are normal. Pupils are equal, round, and reactive to light.  Neck: No JVD present. Carotid bruit is not present.  Cardiovascular: Normal rate, regular rhythm and normal heart sounds.   No murmur heard. Pulses:      Dorsalis pedis pulses are 2+ on the right side, and 2+ on the left side.  Pulmonary/Chest: Effort normal and breath sounds normal. He has no rales.  Abdominal: Soft. Bowel sounds are normal. He exhibits no distension. There is no tenderness.  Genitourinary:  Genitourinary Comments: No apparent inguinal hernia, tender along the upper aspect of right inguinal fold, skin intact, no defect  Musculoskeletal: He exhibits no edema.  Pain with resisted hip flexion, slight discomfort with hip adduction and abduction; internal and external rotation of the hip is pain-free  Neurological: He is alert and oriented to person, place, and time.  Skin: Skin is warm and dry.  Psychiatric: He has a normal mood and affect.  Vitals reviewed.   Vitals:   04/26/17 1128 04/26/17 1238  BP: (!) 162/89 (!) 148/82  Pulse: (!) 107   Resp: 18   Temp: 98 F (36.7 C)   TempSrc: Oral   SpO2: 98%   Weight: 208 lb (94.3 kg)   Height: 5' 8.9" (1.75 m)       Assessment & Plan:    Xaviar Lunn is a 61 y.o. male Type 2 diabetes mellitus with hyperglycemia, without long-term current use of insulin ([place]) - Plan: Hemoglobin A1c, metFORMIN (GLUCOPHAGE) 1000 MG tablet  - Uncontrolled when evaluated in November.  This is his first visit back. Tolerating higher dose of metformin. Check A1c on fasting lab work in the next few days. Handout given regarding standards of care and monitoring intervals for diabetes.  Hyperlipidemia, unspecified hyperlipidemia type - Plan: Lipid panel, Comprehensive metabolic panel  - Check CMP, lipid panel. Intolerant to statins. Consider Repatha if significant elevation.   Right hip pain, Right groin pain - Plan: DG HIP UNILAT W OR W/O PELVIS 2-3 VIEWS RIGHT,  Ambulatory referral to Orthopedic Surgery  - Initial history of possible contusion about 6 weeks ago. On further discussion it appears that his pain has slightly improved initially then continued to worsen to the point where he was combining NSAIDs and using fairly frequently. No soft tissue swelling is seen on exam, NVI distally.  - Advised on risks including elevated Hall pressure, PUD/gastritis, or renal injury with overuse of NSAIDs. Tylenol is safest to start, small doses of NSAID if needed, and tramadol given if needed for breakthrough pain. SED. Will check creatinine on fasting Hall work.  - X-Hall ordered, but likely will need MRI versus ultrasound to evaluate area further. We'll refer to orthopedics for further evaluation and decision on next imaging/evaluation.   --note provided for work for restrictions if possible or out of work until seen by orthopedics due to his report of increasing pain with his work duties.   Meds ordered this encounter  Medications  . metFORMIN (GLUCOPHAGE) 1000 MG tablet    Sig: Take 1 tablet (1,000 mg total) by mouth 2 (two) times daily with a meal.    Dispense:  180 tablet    Refill:  1  . traMADol (ULTRAM) 50 MG tablet    Sig: Take 1 tablet (50 mg total) by mouth every 8 (eight) hours as needed.    Dispense:  20 tablet    Refill:  0   Patient Instructions    I will check your diabetes test again today as well as your cholesterol tests today. Usually will need A1c/diabetes visit every 3 months.  See recommendations below.   Your Hall pressure is elevated today. It should be less than 130/80 with diabetes. Overuse of NSAIDS such as ibuprofen and naprosyn may also affect your Hall pressure.  Do not combine NSAIDS (such as ibuprofen and naprosyn). Keep a record of your Hall pressures outside of the office and if those are remaining above 130/80, I would recommend restarting lisinopril. Let me know and I can send in a new prescription.  Tylenol is safest  if that is effective for your pain. If you do need a stronger medication, I  did write for a few tramadol, but be careful taking that medicine as it can cause sedation or dizziness. I will refer you to orthopedics to evaluate your hip/groin pain further.  Return to the clinic or go to the nearest emergency room if any of your symptoms worsen or new symptoms occur.  I did write a letter for your employer to allow restrictions if possible or out of work until seen by orthopedist if needed with your pain.   Recheck in 3 months for diabetes.  Return in the next 2 days for fasting lab visit as well as x-Hall.    Diabetes Mellitus and Standards of Medical Care Managing diabetes (diabetes mellitus) can be complicated. Your diabetes treatment may be managed by a team of health care providers, including:  A diet and nutrition specialist (registered dietitian).  A nurse.  A certified diabetes educator (CDE).  A diabetes specialist (endocrinologist).  An eye doctor.  A primary care provider.  A dentist.  Your health care providers follow a schedule in order to help you get the best quality of care. The following schedule is a general guideline for your diabetes management plan. Your health care providers may also give you more specific instructions. HbA1c ( hemoglobin A1c) test This test provides information about Hall sugar (glucose) control over the previous 2-3 months. It is used to check whether your diabetes management plan needs to be adjusted.  If you are meeting your treatment goals, this test is done at least 2 times a year.  If you are not meeting treatment goals or if your treatment goals have changed, this test is done 4 times a year.  Hall pressure test  This test is done at every routine medical visit. For most people, the goal is less than 130/80. Ask your health care provider what your goal Hall pressure should be. Dental and eye exams  Visit your dentist two times a  year.  If you have type 1 diabetes, get an eye exam 3-5 years after you are diagnosed, and then once a year after your first exam. ? If you were diagnosed with type 1 diabetes as a child, get an eye exam when you are age 32 or older and have had diabetes for 3-5 years. After the first exam, you should get an eye exam once a year.  If you have type 2 diabetes, have an eye exam as soon as you are diagnosed, and then once a year after your first exam. Foot care exam  Visual foot exams are done at every routine medical visit. The exams check for cuts, bruises, redness, blisters, sores, or other problems with the feet.  A complete foot exam is done by your health care provider once a year. This exam includes an inspection of the structure and skin of your feet, and a check of the pulses and sensation in your feet. ? Type 1 diabetes: Get your first exam 3-5 years after diagnosis. ? Type 2 diabetes: Get your first exam as soon as you are diagnosed.  Check your feet every day for cuts, bruises, redness, blisters, or sores. If you have any of these or other problems that are not healing, contact your health care provider. Kidney function test ( urine microalbumin)  This test is done once a year. ? Type 1 diabetes: Get your first test 5 years after diagnosis. ? Type 2 diabetes: Get your first test as soon as you are diagnosed.  If you have chronic kidney disease (CKD),  get a serum creatinine and estimated glomerular filtration rate (eGFR) test once a year. Lipid profile (cholesterol, HDL, LDL, triglycerides)  This test should be done when you are diagnosed with diabetes, and every 5 years after the first test. If you are on medicines to lower your cholesterol, you may need to get this test done every year. ? The goal for LDL is less than 100 mg/dL (5.5 mmol/L). If you are at high risk, the goal is less than 70 mg/dL (3.9 mmol/L). ? The goal for HDL is 40 mg/dL (2.2 mmol/L) for men and 50 mg/dL(2.8  mmol/L) for women. An HDL cholesterol of 60 mg/dL (3.3 mmol/L) or higher gives some protection against heart disease. ? The goal for triglycerides is less than 150 mg/dL (8.3 mmol/L). Immunizations  The yearly flu (influenza) vaccine is recommended for everyone 6 months or older who has diabetes.  The pneumonia (pneumococcal) vaccine is recommended for everyone 2 years or older who has diabetes. If you are 25 or older, you may get the pneumonia vaccine as a series of two separate shots.  The hepatitis B vaccine is recommended for adults shortly after they have been diagnosed with diabetes.  The Tdap (tetanus, diphtheria, and pertussis) vaccine should be given: ? According to normal childhood vaccination schedules, for children. ? Every 10 years, for adults who have diabetes.  The shingles vaccine is recommended for people who have had chicken pox and are 50 years or older. Mental and emotional health  Screening for symptoms of eating disorders, anxiety, and depression is recommended at the time of diagnosis and afterward as needed. If your screening shows that you have symptoms (you have a positive screening result), you may need further evaluation and be referred to a mental health care provider. Diabetes self-management education  Education about how to manage your diabetes is recommended at diagnosis and ongoing as needed. Treatment plan  Your treatment plan will be reviewed at every medical visit. Summary  Managing diabetes (diabetes mellitus) can be complicated. Your diabetes treatment may be managed by a team of health care providers.  Your health care providers follow a schedule in order to help you get the best quality of care.  Standards of care including having regular physical exams, Hall tests, Hall pressure monitoring, immunizations, screening tests, and education about how to manage your diabetes.  Your health care providers may also give you more specific instructions  based on your individual health. This information is not intended to replace advice given to you by your health care provider. Make sure you discuss any questions you have with your health care provider. Document Released: 08/13/2009 Document Revised: 07/14/2016 Document Reviewed: 07/14/2016 Elsevier Interactive Patient Education  2018 [employer].   IF you received an x-Hall today, you will receive an invoice from [facility]. Please contact [facility] at [phone] with questions or concerns regarding your invoice.   IF you received labwork today, you will receive an invoice from [facility]. Please contact [facility] at [phone] with questions or concerns regarding your invoice.   Our billing staff will not be able to assist you with questions regarding bills from these companies.  You will be contacted with the lab results as soon as they are available. The fastest way to get your results is to activate your My Chart account. Instructions are located on the last page of this paperwork. If you have not heard from [place] regarding the results in 2 weeks, please contact this office.  I personally performed the services described in this documentation, which was scribed in my presence. The recorded information has been reviewed and considered for accuracy and completeness, addended by me as needed, and agree with information above.  Signed,   Steve Ray, MD [facility].  04/26/17 2:23 PM

## 2017-04-26 NOTE — Patient Instructions (Addendum)
I will check your diabetes test again today as well as your cholesterol tests today. Usually will need A1c/diabetes visit every 3 months.  See recommendations below.   Your blood pressure is elevated today. It should be less than 130/80 with diabetes. Overuse of NSAIDS such as ibuprofen and naprosyn may also affect your blood pressure.  Do not combine NSAIDS (such as ibuprofen and naprosyn). Keep a record of your blood pressures outside of the office and if those are remaining above 130/80, I would recommend restarting lisinopril. Let me know and I can send in a new prescription.  Tylenol is safest if that is effective for your pain. If you do need a stronger medication, I did write for a few tramadol, but be careful taking that medicine as it can cause sedation or dizziness. I will refer you to orthopedics to evaluate your hip/groin pain further.  Return to the clinic or go to the nearest emergency room if any of your symptoms worsen or new symptoms occur.  I did write a letter for your employer to allow restrictions if possible or out of work until seen by orthopedist if needed with your pain.   Recheck in 3 months for diabetes.  Return in the next 2 days for fasting lab visit as well as x-ray.    Diabetes Mellitus and Standards of Medical Care Managing diabetes (diabetes mellitus) can be complicated. Your diabetes treatment may be managed by a team of health care providers, including:  A diet and nutrition specialist (registered dietitian).  A nurse.  A certified diabetes educator (CDE).  A diabetes specialist (endocrinologist).  An eye doctor.  A primary care provider.  A dentist.  Your health care providers follow a schedule in order to help you get the best quality of care. The following schedule is a general guideline for your diabetes management plan. Your health care providers may also give you more specific instructions. HbA1c ( hemoglobin A1c) test This test provides  information about blood sugar (glucose) control over the previous 2-3 months. It is used to check whether your diabetes management plan needs to be adjusted.  If you are meeting your treatment goals, this test is done at least 2 times a year.  If you are not meeting treatment goals or if your treatment goals have changed, this test is done 4 times a year.  Blood pressure test  This test is done at every routine medical visit. For most people, the goal is less than 130/80. Ask your health care provider what your goal blood pressure should be. Dental and eye exams  Visit your dentist two times a year.  If you have type 1 diabetes, get an eye exam 3-5 years after you are diagnosed, and then once a year after your first exam. ? If you were diagnosed with type 1 diabetes as a child, get an eye exam when you are age 52 or older and have had diabetes for 3-5 years. After the first exam, you should get an eye exam once a year.  If you have type 2 diabetes, have an eye exam as soon as you are diagnosed, and then once a year after your first exam. Foot care exam  Visual foot exams are done at every routine medical visit. The exams check for cuts, bruises, redness, blisters, sores, or other problems with the feet.  A complete foot exam is done by your health care provider once a year. This exam includes an inspection of the structure and  skin of your feet, and a check of the pulses and sensation in your feet. ? Type 1 diabetes: Get your first exam 3-5 years after diagnosis. ? Type 2 diabetes: Get your first exam as soon as you are diagnosed.  Check your feet every day for cuts, bruises, redness, blisters, or sores. If you have any of these or other problems that are not healing, contact your health care provider. Kidney function test ( urine microalbumin)  This test is done once a year. ? Type 1 diabetes: Get your first test 5 years after diagnosis. ? Type 2 diabetes: Get your first test as soon  as you are diagnosed.  If you have chronic kidney disease (CKD), get a serum creatinine and estimated glomerular filtration rate (eGFR) test once a year. Lipid profile (cholesterol, HDL, LDL, triglycerides)  This test should be done when you are diagnosed with diabetes, and every 5 years after the first test. If you are on medicines to lower your cholesterol, you may need to get this test done every year. ? The goal for LDL is less than 100 mg/dL (5.5 mmol/L). If you are at high risk, the goal is less than 70 mg/dL (3.9 mmol/L). ? The goal for HDL is 40 mg/dL (2.2 mmol/L) for men and 50 mg/dL(2.8 mmol/L) for women. An HDL cholesterol of 60 mg/dL (3.3 mmol/L) or higher gives some protection against heart disease. ? The goal for triglycerides is less than 150 mg/dL (8.3 mmol/L). Immunizations  The yearly flu (influenza) vaccine is recommended for everyone 6 months or older who has diabetes.  The pneumonia (pneumococcal) vaccine is recommended for everyone 2 years or older who has diabetes. If you are 37 or older, you may get the pneumonia vaccine as a series of two separate shots.  The hepatitis B vaccine is recommended for adults shortly after they have been diagnosed with diabetes.  The Tdap (tetanus, diphtheria, and pertussis) vaccine should be given: ? According to normal childhood vaccination schedules, for children. ? Every 10 years, for adults who have diabetes.  The shingles vaccine is recommended for people who have had chicken pox and are 50 years or older. Mental and emotional health  Screening for symptoms of eating disorders, anxiety, and depression is recommended at the time of diagnosis and afterward as needed. If your screening shows that you have symptoms (you have a positive screening result), you may need further evaluation and be referred to a mental health care provider. Diabetes self-management education  Education about how to manage your diabetes is recommended at  diagnosis and ongoing as needed. Treatment plan  Your treatment plan will be reviewed at every medical visit. Summary  Managing diabetes (diabetes mellitus) can be complicated. Your diabetes treatment may be managed by a team of health care providers.  Your health care providers follow a schedule in order to help you get the best quality of care.  Standards of care including having regular physical exams, blood tests, blood pressure monitoring, immunizations, screening tests, and education about how to manage your diabetes.  Your health care providers may also give you more specific instructions based on your individual health. This information is not intended to replace advice given to you by your health care provider. Make sure you discuss any questions you have with your health care provider. Document Released: 08/13/2009 Document Revised: 07/14/2016 Document Reviewed: 07/14/2016 Elsevier Interactive Patient Education  [employer].   IF you received an x-ray today, you will receive an invoice from  [facility]. Please contact [facility] at [phone] with questions or concerns regarding your invoice.   IF you received labwork today, you will receive an invoice from [facility]. Please contact [facility] at [phone] with questions or concerns regarding your invoice.   Our billing staff will not be able to assist you with questions regarding bills from these companies.  You will be contacted with the lab results as soon as they are available. The fastest way to get your results is to activate your My Chart account. Instructions are located on the last page of this paperwork. If you have not heard from [place] regarding the results in 2 weeks, please contact this office.

## 2017-04-27 ENCOUNTER — Ambulatory Visit (INDEPENDENT_AMBULATORY_CARE_PROVIDER_SITE_OTHER): Payer: BLUE CROSS/BLUE SHIELD

## 2017-04-27 ENCOUNTER — Ambulatory Visit (INDEPENDENT_AMBULATORY_CARE_PROVIDER_SITE_OTHER): Payer: BLUE CROSS/BLUE SHIELD | Admitting: Family Medicine

## 2017-04-27 ENCOUNTER — Other Ambulatory Visit: Payer: Self-pay | Admitting: Family Medicine

## 2017-04-27 DIAGNOSIS — R1031 Right lower quadrant pain: Secondary | ICD-10-CM | POA: Diagnosis not present

## 2017-04-27 DIAGNOSIS — E785 Hyperlipidemia, unspecified: Secondary | ICD-10-CM

## 2017-04-27 DIAGNOSIS — E1165 Type 2 diabetes mellitus with hyperglycemia: Secondary | ICD-10-CM

## 2017-04-27 DIAGNOSIS — M25551 Pain in right hip: Secondary | ICD-10-CM

## 2017-04-27 LAB — COMPREHENSIVE METABOLIC PANEL
A/G RATIO: 1.4 (ref 1.2–2.2)
ALBUMIN: 3.8 g/dL (ref 3.6–4.8)
ALK PHOS: 61 IU/L (ref 39–117)
ALT: 15 IU/L (ref 0–44)
AST: 14 IU/L (ref 0–40)
BUN / CREAT RATIO: 15 (ref 10–24)
BUN: 11 mg/dL (ref 8–27)
Bilirubin Total: 0.5 mg/dL (ref 0.0–1.2)
CO2: 26 mmol/L (ref 20–29)
CREATININE: 0.71 mg/dL — AB (ref 0.76–1.27)
Calcium: 9.4 mg/dL (ref 8.6–10.2)
Chloride: 97 mmol/L (ref 96–106)
GFR calc Af Amer: 116 mL/min/{1.73_m2} (ref >59)
GFR, EST NON AFRICAN AMERICAN: 101 mL/min/{1.73_m2} (ref >59)
GLOBULIN, TOTAL: 2.8 g/dL (ref 1.5–4.5)
Glucose: 154 mg/dL — ABNORMAL HIGH (ref 65–99)
POTASSIUM: 4.7 mmol/L (ref 3.5–5.2)
SODIUM: 138 mmol/L (ref 134–144)
Total Protein: 6.6 g/dL (ref 6.0–8.5)

## 2017-04-27 LAB — LIPID PANEL
CHOLESTEROL TOTAL: 158 mg/dL (ref 100–199)
Chol/HDL Ratio: 3.9 ratio (ref 0.0–5.0)
HDL: 41 mg/dL (ref >39)
LDL CALC: 100 mg/dL — AB (ref 0–99)
TRIGLYCERIDES: 85 mg/dL (ref 0–149)
VLDL CHOLESTEROL CAL: 17 mg/dL (ref 5–40)

## 2017-04-27 LAB — HEMOGLOBIN A1C
Est. average glucose Bld gHb Est-mCnc: 183 mg/dL
HEMOGLOBIN A1C: 8 % — AB (ref 4.8–5.6)

## 2017-04-27 NOTE — Addendum Note (Signed)
Addended by: Rudell CobbGOLDSMITH, Liani Caris M on: 04/27/2017 08:36 AM   Modules accepted: Orders

## 2017-04-28 ENCOUNTER — Encounter: Payer: Self-pay | Admitting: Family Medicine

## 2017-04-28 ENCOUNTER — Other Ambulatory Visit: Payer: BLUE CROSS/BLUE SHIELD | Admitting: Family Medicine

## 2017-04-28 NOTE — Telephone Encounter (Signed)
Refill request for tramadol. He received #20 on June 28. to be taken every 8 hours as needed. too early for refill.

## 2017-04-29 NOTE — Progress Notes (Signed)
Lab only. Pt not seen.

## 2017-05-01 ENCOUNTER — Telehealth: Payer: Self-pay | Admitting: Family Medicine

## 2017-05-01 NOTE — Telephone Encounter (Signed)
PATIENT STATES HE SAW DR. Neva Seat[place]ENE [facility] [address] AND HE HAD TO RETURN ON Friday TO HAVE HIS BLOOD WORK BECAUSE HE WAS NOT FASTING. THE ASSISTANT TOLD HIM HE SHOULD GET THE RESULTS ON HIS MY-CHART BY Monday (04/30/17). HE IS CALLING BECAUSE THEY ARE NOT THERE. BEST PHONE [phone] (CELL) MBC

## 2017-05-01 NOTE — Telephone Encounter (Signed)
Message left advising patient his results have not been reviewed by the provider.  He should expect an phone call or his results will be released on mychart.

## 2017-05-07 ENCOUNTER — Telehealth: Payer: Self-pay

## 2017-05-07 MED ORDER — ACETAMINOPHEN-CODEINE #3 300-30 MG PO TABS: 1.0000 | ORAL_TABLET | ORAL | 0 refills | Status: DC | PRN

## 2017-05-07 NOTE — Telephone Encounter (Signed)
Note reviewed. Tramadol bottle received and will dispose of remaining pills. New Rx for tylenol #3 can be faxed in. Follow up with ortho as planned. [facility] sent [facility]

## 2017-05-07 NOTE — Telephone Encounter (Signed)
Patient brought in note for Dr. Neva SeatGreene & Tramadol medication that he does not want. Tramadol was given to PA. [name] for discard. Note reads:  "Dear Dr. Neva SeatGreene, Tramadol HCL 50 mg made me totally unable to function and feel horrible. My body just can't tolerate this drug. If it affects others like it does me we would all be practically bed ridden. I know that tylenol #3 doesn't have these effects, it has been tried & true over the years. Would it be possible to get some. I want to give Aleve a rest. Thanks, Steve Hall  PS  Dispense of Tramadol as you see fit"     PLEASE ADVISE. Pts letter placed in scan box.

## 2017-10-18 ENCOUNTER — Other Ambulatory Visit: Payer: Self-pay | Admitting: Family Medicine

## 2017-10-18 DIAGNOSIS — E1165 Type 2 diabetes mellitus with hyperglycemia: Secondary | ICD-10-CM

## 2017-11-15 ENCOUNTER — Other Ambulatory Visit: Payer: Self-pay | Admitting: Family Medicine

## 2017-11-15 DIAGNOSIS — E1165 Type 2 diabetes mellitus with hyperglycemia: Secondary | ICD-10-CM

## 2017-11-15 NOTE — Telephone Encounter (Signed)
Last OV 04/26/17 with Dr. Neva SeatGreene.  No future appts noted for follow up.

## 2017-12-28 ENCOUNTER — Ambulatory Visit (INDEPENDENT_AMBULATORY_CARE_PROVIDER_SITE_OTHER): Payer: BLUE CROSS/BLUE SHIELD | Admitting: Urgent Care

## 2017-12-28 ENCOUNTER — Encounter: Payer: Self-pay | Admitting: Urgent Care

## 2017-12-28 VITALS — BP 119/76 | HR 93 | Temp 98.2°F | Resp 18 | Ht 68.9 in | Wt 216.2 lb

## 2017-12-28 DIAGNOSIS — E119 Type 2 diabetes mellitus without complications: Secondary | ICD-10-CM | POA: Diagnosis not present

## 2017-12-28 DIAGNOSIS — E782 Mixed hyperlipidemia: Secondary | ICD-10-CM | POA: Diagnosis not present

## 2017-12-28 DIAGNOSIS — E669 Obesity, unspecified: Secondary | ICD-10-CM

## 2017-12-28 DIAGNOSIS — Z6832 Body mass index (BMI) 32.0-32.9, adult: Secondary | ICD-10-CM | POA: Diagnosis not present

## 2017-12-28 MED ORDER — METFORMIN HCL 1000 MG PO TABS: 1000.0000 mg | ORAL_TABLET | Freq: Two times a day (BID) | ORAL | 1 refills | Status: DC

## 2017-12-28 NOTE — Patient Instructions (Addendum)
Diabetes Mellitus and Nutrition When you have diabetes (diabetes mellitus), it is very important to have healthy eating habits because your blood sugar (glucose) levels are greatly affected by what you eat and drink. Eating healthy foods in the appropriate amounts, at about the same times every day, can help you:  Control your blood glucose.  Lower your risk of heart disease.  Improve your blood pressure.  Reach or maintain a healthy weight.  Every person with diabetes is different, and each person has different needs for a meal plan. Your health care provider may recommend that you work with a diet and nutrition specialist (dietitian) to make a meal plan that is best for you. Your meal plan may vary depending on factors such as:  The calories you need.  The medicines you take.  Your weight.  Your blood glucose, blood pressure, and cholesterol levels.  Your activity level.  Other health conditions you have, such as heart or kidney disease.  How do carbohydrates affect me? Carbohydrates affect your blood glucose level more than any other type of food. Eating carbohydrates naturally increases the amount of glucose in your blood. Carbohydrate counting is a method for keeping track of how many carbohydrates you eat. Counting carbohydrates is important to keep your blood glucose at a healthy level, especially if you use insulin or take certain oral diabetes medicines. It is important to know how many carbohydrates you can safely have in each meal. This is different for every person. Your dietitian can help you calculate how many carbohydrates you should have at each meal and for snack. Foods that contain carbohydrates include:  Bread, cereal, rice, pasta, and crackers.  Potatoes and corn.  Peas, beans, and lentils.  Milk and yogurt.  Fruit and juice.  Desserts, such as cakes, cookies, ice cream, and candy.  How does alcohol affect me? Alcohol can cause a sudden decrease in blood  glucose (hypoglycemia), especially if you use insulin or take certain oral diabetes medicines. Hypoglycemia can be a life-threatening condition. Symptoms of hypoglycemia (sleepiness, dizziness, and confusion) are similar to symptoms of having too much alcohol. If your health care provider says that alcohol is safe for you, follow these guidelines:  Limit alcohol intake to no more than 1 drink per day for nonpregnant women and 2 drinks per day for men. One drink equals 12 oz of beer, 5 oz of wine, or 1 oz of hard liquor.  Do not drink on an empty stomach.  Keep yourself hydrated with water, diet soda, or unsweetened iced tea.  Keep in mind that regular soda, juice, and other mixers may contain a lot of sugar and must be counted as carbohydrates.  What are tips for following this plan? Reading food labels  Start by checking the serving size on the label. The amount of calories, carbohydrates, fats, and other nutrients listed on the label are based on one serving of the food. Many foods contain more than one serving per package.  Check the total grams (g) of carbohydrates in one serving. You can calculate the number of servings of carbohydrates in one serving by dividing the total carbohydrates by 15. For example, if a food has 30 g of total carbohydrates, it would be equal to 2 servings of carbohydrates.  Check the number of grams (g) of saturated and trans fats in one serving. Choose foods that have low or no amount of these fats.  Check the number of milligrams (mg) of sodium in one serving. Most people   should limit total sodium intake to less than 2,300 mg per day.  Always check the nutrition information of foods labeled as "low-fat" or "nonfat". These foods may be higher in added sugar or refined carbohydrates and should be avoided.  Talk to your dietitian to identify your daily goals for nutrients listed on the label. Shopping  Avoid buying canned, premade, or processed foods. These  foods tend to be high in fat, sodium, and added sugar.  Shop around the outside edge of the grocery store. This includes fresh fruits and vegetables, bulk grains, fresh meats, and fresh dairy. Cooking  Use low-heat cooking methods, such as baking, instead of high-heat cooking methods like deep frying.  Cook using healthy oils, such as olive, canola, or sunflower oil.  Avoid cooking with butter, cream, or high-fat meats. Meal planning  Eat meals and snacks regularly, preferably at the same times every day. Avoid going long periods of time without eating.  Eat foods high in fiber, such as fresh fruits, vegetables, beans, and whole grains. Talk to your dietitian about how many servings of carbohydrates you can eat at each meal.  Eat 4-6 ounces of lean protein each day, such as lean meat, chicken, fish, eggs, or tofu. 1 ounce is equal to 1 ounce of meat, chicken, or fish, 1 egg, or 1/4 cup of tofu.  Eat some foods each day that contain healthy fats, such as avocado, nuts, seeds, and fish. Lifestyle   Check your blood glucose regularly.  Exercise at least 30 minutes 5 or more days each week, or as told by your health care provider.  Take medicines as told by your health care provider.  Do not use any products that contain nicotine or tobacco, such as cigarettes and e-cigarettes. If you need help quitting, ask your health care provider.  Work with a Social worker or diabetes educator to identify strategies to manage stress and any emotional and social challenges. What are some questions to ask my health care provider?  Do I need to meet with a diabetes educator?  Do I need to meet with a dietitian?  What number can I call if I have questions?  When are the best times to check my blood glucose? Where to find more information:  American Diabetes Association: diabetes.org/food-and-fitness/food  Academy of Nutrition and Dietetics:  PokerClues.dk  [employer] of Diabetes and Digestive and Kidney Diseases (NIH): ContactWire.be Summary  A healthy meal plan will help you control your blood glucose and maintain a healthy lifestyle.  Working with a diet and nutrition specialist (dietitian) can help you make a meal plan that is best for you.  Keep in mind that carbohydrates and alcohol have immediate effects on your blood glucose levels. It is important to count carbohydrates and to use alcohol carefully. This information is not intended to replace advice given to you by your health care provider. Make sure you discuss any questions you have with your health care provider. Document Released: 07/13/2005 Document Revised: 11/20/2016 Document Reviewed: 11/20/2016 Elsevier Interactive Patient Education  2018 [employer].     Diabetes and Foot Care Diabetes may cause you to have problems because of poor blood supply (circulation) to your feet and legs. This may cause the skin on your feet to become thinner, break easier, and heal more slowly. Your skin may become dry, and the skin may peel and crack. You may also have nerve damage in your legs and feet causing decreased feeling in them. You may not notice minor injuries  to your feet that could lead to infections or more serious problems. Taking care of your feet is one of the most important things you can do for yourself. Follow these instructions at home:  Wear shoes at all times, even in the house. Do not go barefoot. Bare feet are easily injured.  Check your feet daily for blisters, cuts, and redness. If you cannot see the bottom of your feet, use a mirror or ask someone for help.  Wash your feet with warm water (do not use hot water) and mild soap. Then pat your feet and the areas between your toes until they are completely dry. Do not soak your  feet as this can dry your skin.  Apply a moisturizing lotion or petroleum jelly (that does not contain alcohol and is unscented) to the skin on your feet and to dry, brittle toenails. Do not apply lotion between your toes.  Trim your toenails straight across. Do not dig under them or around the cuticle. File the edges of your nails with an emery board or nail file.  Do not cut corns or calluses or try to remove them with medicine.  Wear clean socks or stockings every day. Make sure they are not too tight. Do not wear knee-high stockings since they may decrease blood flow to your legs.  Wear shoes that fit properly and have enough cushioning. To break in new shoes, wear them for just a few hours a day. This prevents you from injuring your feet. Always look in your shoes before you put them on to be sure there are no objects inside.  Do not cross your legs. This may decrease the blood flow to your feet.  If you find a minor scrape, cut, or break in the skin on your feet, keep it and the skin around it clean and dry. These areas may be cleansed with mild soap and water. Do not cleanse the area with peroxide, alcohol, or iodine.  When you remove an adhesive bandage, be sure not to damage the skin around it.  If you have a wound, look at it several times a day to make sure it is healing.  Do not use heating pads or hot water bottles. They may burn your skin. If you have lost feeling in your feet or legs, you may not know it is happening until it is too late.  Make sure your health care provider performs a complete foot exam at least annually or more often if you have foot problems. Report any cuts, sores, or bruises to your health care provider immediately. Contact a health care provider if:  You have an injury that is not healing.  You have cuts or breaks in the skin.  You have an ingrown nail.  You notice redness on your legs or feet.  You feel burning or tingling in your legs or  feet.  You have pain or cramps in your legs and feet.  Your legs or feet are numb.  Your feet always feel cold. Get help right away if:  There is increasing redness, swelling, or pain in or around a wound.  There is a red line that goes up your leg.  Pus is coming from a wound.  You develop a fever or as directed by your health care provider.  You notice a bad smell coming from an ulcer or wound. This information is not intended to replace advice given to you by your health care provider. Make sure you discuss  any questions you have with your health care provider. Document Released: 10/13/2000 Document Revised: 03/23/2016 Document Reviewed: 03/25/2013 Elsevier Interactive Patient Education  2017 [employer].      Diabetes Mellitus and Standards of Medical Care Managing diabetes (diabetes mellitus) can be complicated. Your diabetes treatment may be managed by a team of health care providers, including:  A diet and nutrition specialist (registered dietitian).  A nurse.  A certified diabetes educator (CDE).  A diabetes specialist (endocrinologist).  An eye doctor.  A primary care provider.  A dentist.  Your health care providers follow a schedule in order to help you get the best quality of care. The following schedule is a general guideline for your diabetes management plan. Your health care providers may also give you more specific instructions. HbA1c ( hemoglobin A1c) test This test provides information about blood sugar (glucose) control over the previous 2-3 months. It is used to check whether your diabetes management plan needs to be adjusted.  If you are meeting your treatment goals, this test is done at least 2 times a year.  If you are not meeting treatment goals or if your treatment goals have changed, this test is done 4 times a year.  Blood pressure test  This test is done at every routine medical visit. For most people, the goal is less than 130/80.  Ask your health care provider what your goal blood pressure should be. Dental and eye exams  Visit your dentist two times a year.  If you have type 1 diabetes, get an eye exam 3-5 years after you are diagnosed, and then once a year after your first exam. ? If you were diagnosed with type 1 diabetes as a child, get an eye exam when you are age 37 or older and have had diabetes for 3-5 years. After the first exam, you should get an eye exam once a year.  If you have type 2 diabetes, have an eye exam as soon as you are diagnosed, and then once a year after your first exam. Foot care exam  Visual foot exams are done at every routine medical visit. The exams check for cuts, bruises, redness, blisters, sores, or other problems with the feet.  A complete foot exam is done by your health care provider once a year. This exam includes an inspection of the structure and skin of your feet, and a check of the pulses and sensation in your feet. ? Type 1 diabetes: Get your first exam 3-5 years after diagnosis. ? Type 2 diabetes: Get your first exam as soon as you are diagnosed.  Check your feet every day for cuts, bruises, redness, blisters, or sores. If you have any of these or other problems that are not healing, contact your health care provider. Kidney function test ( urine microalbumin)  This test is done once a year. ? Type 1 diabetes: Get your first test 5 years after diagnosis. ? Type 2 diabetes: Get your first test as soon as you are diagnosed.  If you have chronic kidney disease (CKD), get a serum creatinine and estimated glomerular filtration rate (eGFR) test once a year. Lipid profile (cholesterol, HDL, LDL, triglycerides)  This test should be done when you are diagnosed with diabetes, and every 5 years after the first test. If you are on medicines to lower your cholesterol, you may need to get this test done every year. ? The goal for LDL is less than 100 mg/dL (5.5 mmol/L). If you are at  high risk, the goal is less than 70 mg/dL (3.9 mmol/L). ? The goal for HDL is 40 mg/dL (2.2 mmol/L) for men and 50 mg/dL(2.8 mmol/L) for women. An HDL cholesterol of 60 mg/dL (3.3 mmol/L) or higher gives some protection against heart disease. ? The goal for triglycerides is less than 150 mg/dL (8.3 mmol/L). Immunizations  The yearly flu (influenza) vaccine is recommended for everyone 6 months or older who has diabetes.  The pneumonia (pneumococcal) vaccine is recommended for everyone 2 years or older who has diabetes. If you are 26 or older, you may get the pneumonia vaccine as a series of two separate shots.  The hepatitis B vaccine is recommended for adults shortly after they have been diagnosed with diabetes.  The Tdap (tetanus, diphtheria, and pertussis) vaccine should be given: ? According to normal childhood vaccination schedules, for children. ? Every 10 years, for adults who have diabetes.  The shingles vaccine is recommended for people who have had chicken pox and are 50 years or older. Mental and emotional health  Screening for symptoms of eating disorders, anxiety, and depression is recommended at the time of diagnosis and afterward as needed. If your screening shows that you have symptoms (you have a positive screening result), you may need further evaluation and be referred to a mental health care provider. Diabetes self-management education  Education about how to manage your diabetes is recommended at diagnosis and ongoing as needed. Treatment plan  Your treatment plan will be reviewed at every medical visit. Summary  Managing diabetes (diabetes mellitus) can be complicated. Your diabetes treatment may be managed by a team of health care providers.  Your health care providers follow a schedule in order to help you get the best quality of care.  Standards of care including having regular physical exams, blood tests, blood pressure monitoring, immunizations, screening  tests, and education about how to manage your diabetes.  Your health care providers may also give you more specific instructions based on your individual health. This information is not intended to replace advice given to you by your health care provider. Make sure you discuss any questions you have with your health care provider. Document Released: 08/13/2009 Document Revised: 07/14/2016 Document Reviewed: 07/14/2016 Elsevier Interactive Patient Education  2018 [employer].      IF you received an x-ray today, you will receive an invoice from [facility]. Please contact [facility] at [phone] with questions or concerns regarding your invoice.   IF you received labwork today, you will receive an invoice from [facility]. Please contact [facility] at [phone] with questions or concerns regarding your invoice.   Our billing staff will not be able to assist you with questions regarding bills from these companies.  You will be contacted with the lab results as soon as they are available. The fastest way to get your results is to activate your My Chart account. Instructions are located on the last page of this paperwork. If you have not heard from [place] regarding the results in 2 weeks, please contact this office.

## 2017-12-28 NOTE — Progress Notes (Signed)
   MRN: [id number]  Subjective:   Steve Hall is a 62 y.o. male who presents for follow up of Type 2 Diabetes Mellitus.   Patient is currently managed with metformin, ran out of his medication this morning. Patient denies chest pain, nausea, vomiting, abdominal pain, hematuria, skin infections. Patient is checking their feet daily. Denies foot concerns. Patient is not interested in standards of medical care. States that all his labs were checked last June 2018. He does not want his cholesterol rechecked, has a history of allergic rash with statin. Refused vaccines. All he wants today is to have his metformin refilled.  Known diabetic complications: none  Steve Hall has a current medication list which includes the following prescription(s): one touch ultra system kit, glucose blood, loratadine, and metformin. Also is allergic to statins and tramadol.  Steve Hall  has a past medical history of Allergy, Diabetes mellitus without complication ([place]), and Hypertension. Also  has a past surgical history that includes Tonsillectomy (at age 31).  Immunizations: Refused all vaccines.   Objective:   PHYSICAL EXAM BP 119/76   Pulse 93   Temp 98.2 F (36.8 C) (Oral)   Resp 18   Ht 5' 8.9" (1.75 m)   Wt 216 lb 3.2 oz (98.1 kg)   SpO2 96%   BMI 32.02 kg/m   The 10-year ASCVD risk score Mikey Bussing DC Jr., et al., 2013) is: 15.6%   Values used to calculate the score:     Age: 8 years     Sex: Male     Is Non-Hispanic African American: No     Diabetic: Yes     Tobacco smoker: No     Systolic Blood Pressure: 373 mmHg     Is BP treated: No     HDL Cholesterol: 41 mg/dL     Total Cholesterol: 158 mg/dL   Physical Exam  Constitutional: He is oriented to person, place, and time. He appears well-developed and well-nourished.  Cardiovascular: Normal rate, regular rhythm and intact distal pulses. Exam reveals no gallop and no friction rub.  No murmur heard. Pulmonary/Chest: No respiratory distress. He  has no wheezes. He has no rales.  Neurological: He is alert and oriented to person, place, and time.   Assessment and Plan :   Type 2 diabetes mellitus without complication, without long-term current use of insulin (HCC) - Plan: Comprehensive metabolic panel, Microalbumin / creatinine urine ratio, Hemoglobin A1c, metFORMIN (GLUCOPHAGE) 1000 MG tablet, CANCELED: POCT glycosylated hemoglobin (Hb A1C)  Mixed hyperlipidemia - Plan: CANCELED: Lipid panel  Class 1 obesity without serious comorbidity with body mass index (BMI) of 32.0 to 32.9 in adult, unspecified obesity type  Patient is not agreeable to standards of medical care. Refilled metformin for 6 months. Patient is to follow up then with another provider.   Jaynee Eagles, PA-C [facility] [phone] 12/28/2017 10:53 AM

## 2017-12-29 LAB — COMPREHENSIVE METABOLIC PANEL
A/G RATIO: 1.6 (ref 1.2–2.2)
ALT: 20 IU/L (ref 0–44)
AST: 13 IU/L (ref 0–40)
Albumin: 4.2 g/dL (ref 3.6–4.8)
Alkaline Phosphatase: 79 IU/L (ref 39–117)
BILIRUBIN TOTAL: 0.3 mg/dL (ref 0.0–1.2)
BUN / CREAT RATIO: 15 (ref 10–24)
BUN: 15 mg/dL (ref 8–27)
CHLORIDE: 98 mmol/L (ref 96–106)
CO2: 25 mmol/L (ref 20–29)
Calcium: 9.4 mg/dL (ref 8.6–10.2)
Creatinine, Ser: 0.98 mg/dL (ref 0.76–1.27)
GFR calc non Af Amer: 82 mL/min/{1.73_m2} (ref >59)
GFR, EST AFRICAN AMERICAN: 95 mL/min/{1.73_m2} (ref >59)
Globulin, Total: 2.6 g/dL (ref 1.5–4.5)
Glucose: 210 mg/dL — ABNORMAL HIGH (ref 65–99)
POTASSIUM: 4.7 mmol/L (ref 3.5–5.2)
Sodium: 139 mmol/L (ref 134–144)
TOTAL PROTEIN: 6.8 g/dL (ref 6.0–8.5)

## 2017-12-29 LAB — MICROALBUMIN / CREATININE URINE RATIO
Creatinine, Urine: 112.4 mg/dL
Microalbumin, Urine: <3 ug/mL

## 2017-12-29 LAB — HEMOGLOBIN A1C
Est. average glucose Bld gHb Est-mCnc: 235 mg/dL
Hgb A1c MFr Bld: 9.8 % — ABNORMAL HIGH (ref 4.8–5.6)

## 2018-01-08 ENCOUNTER — Encounter: Payer: Self-pay | Admitting: Urgent Care

## 2018-06-25 ENCOUNTER — Other Ambulatory Visit: Payer: Self-pay | Admitting: Urgent Care

## 2018-06-25 DIAGNOSIS — E119 Type 2 diabetes mellitus without complications: Secondary | ICD-10-CM

## 2018-06-25 NOTE — Telephone Encounter (Signed)
Metformin 1000 mg refill Last Refill:12/28/17 # 180 with 1 refill Last OV: 12/28/17 PCP: Herma CarsonJ. Greene Pharmacy: [id number]  Hgb A1C elevated at 9.8  Last one on 12/28/17 Please review.

## 2018-08-19 ENCOUNTER — Other Ambulatory Visit: Payer: Self-pay | Admitting: Urgent Care

## 2018-08-19 DIAGNOSIS — E119 Type 2 diabetes mellitus without complications: Secondary | ICD-10-CM

## 2018-08-26 IMAGING — DX DG HIP (WITH OR WITHOUT PELVIS) 2-3V*R*
3 series · 3 of 3 positions shown · non-contrast
Comparison: None.

CLINICAL DATA: Type 2 diabetes mellitus with hyperglycemia, without
long-term current use of insulin Right hip pain Right groin pain

EXAM:
DG HIP (WITH OR WITHOUT PELVIS) 2-3V RIGHT

[pelvis ap]
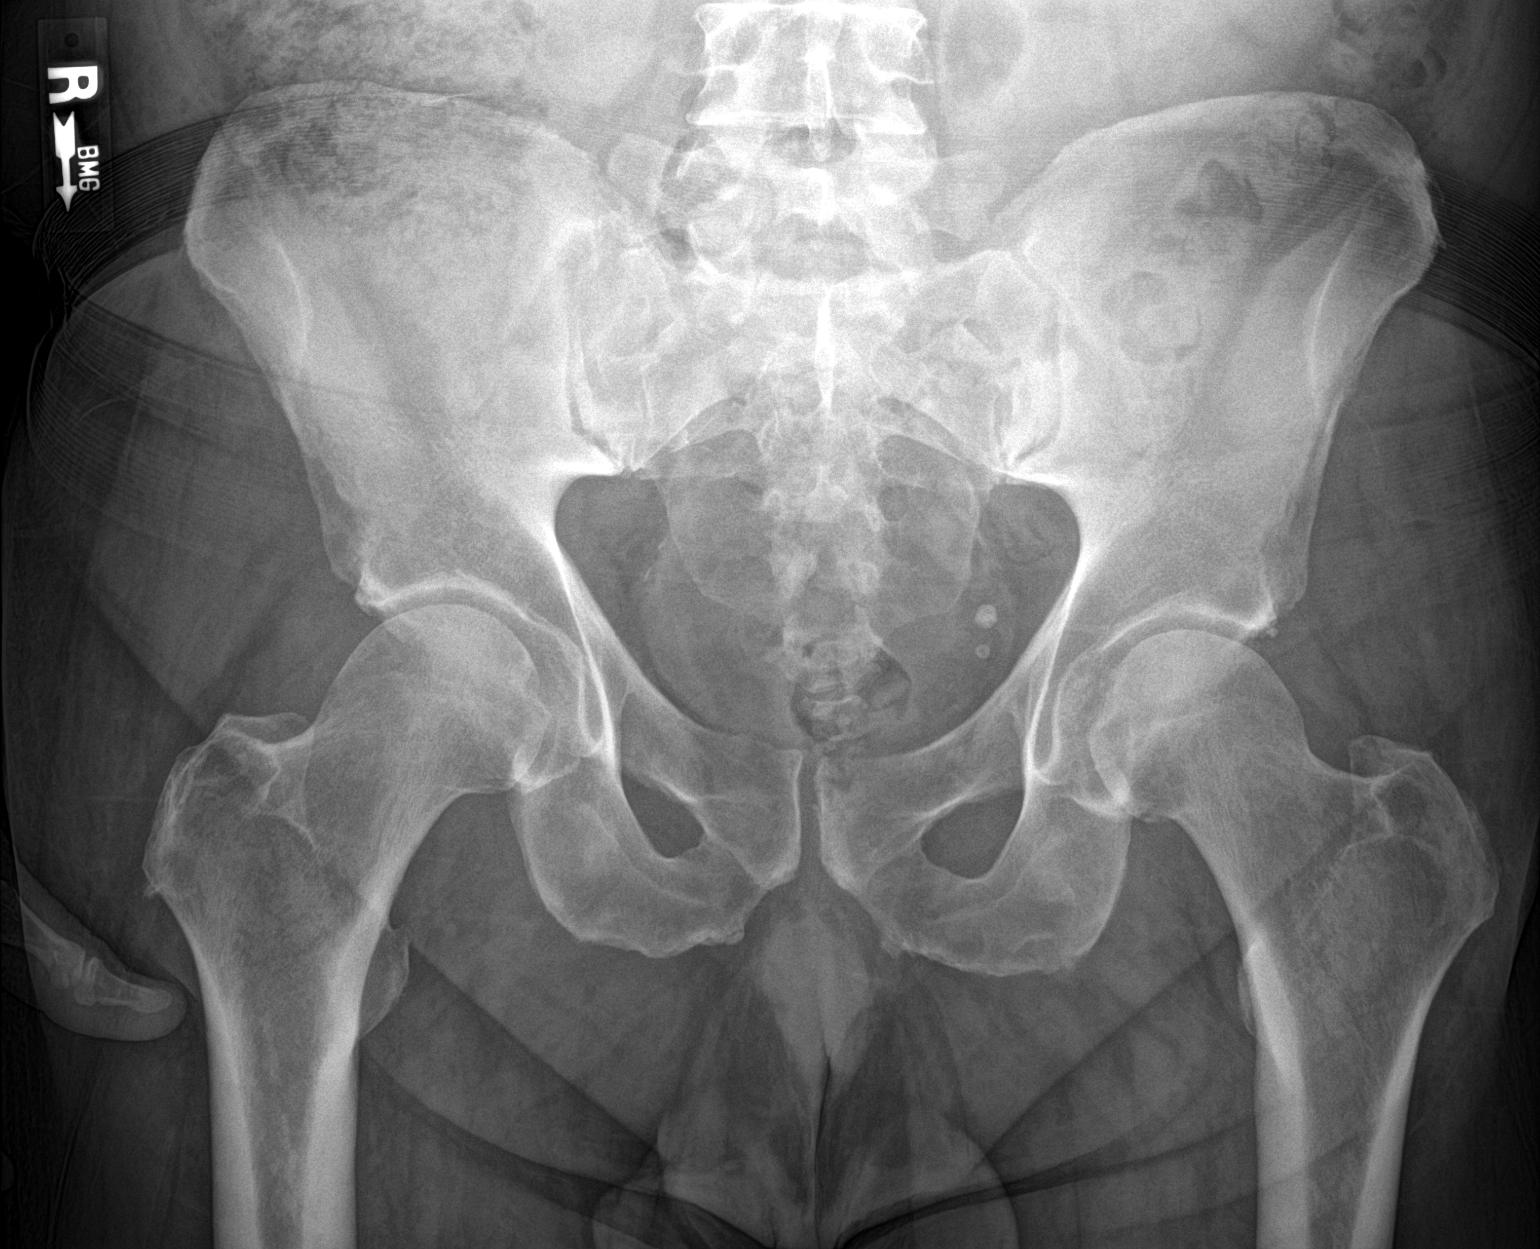

[hip ap]
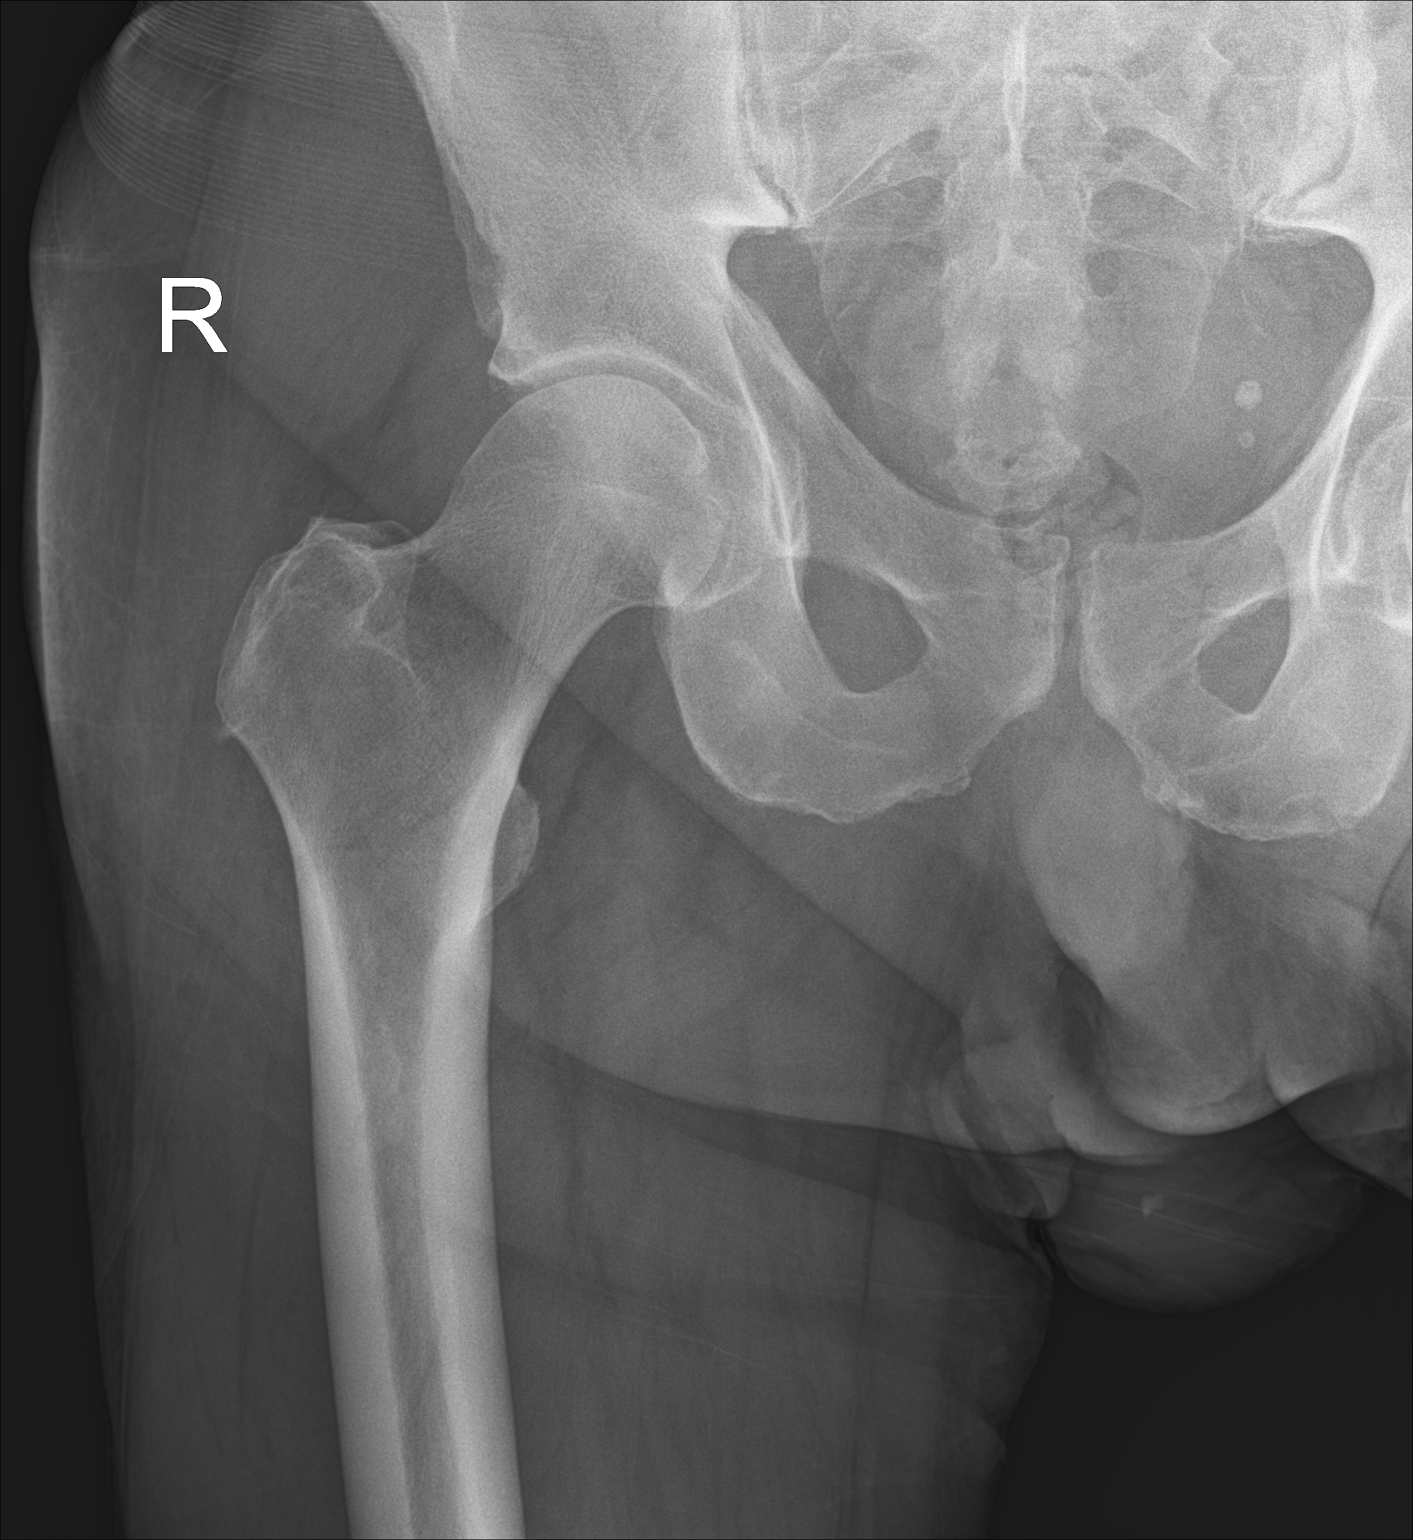

[hip lat]
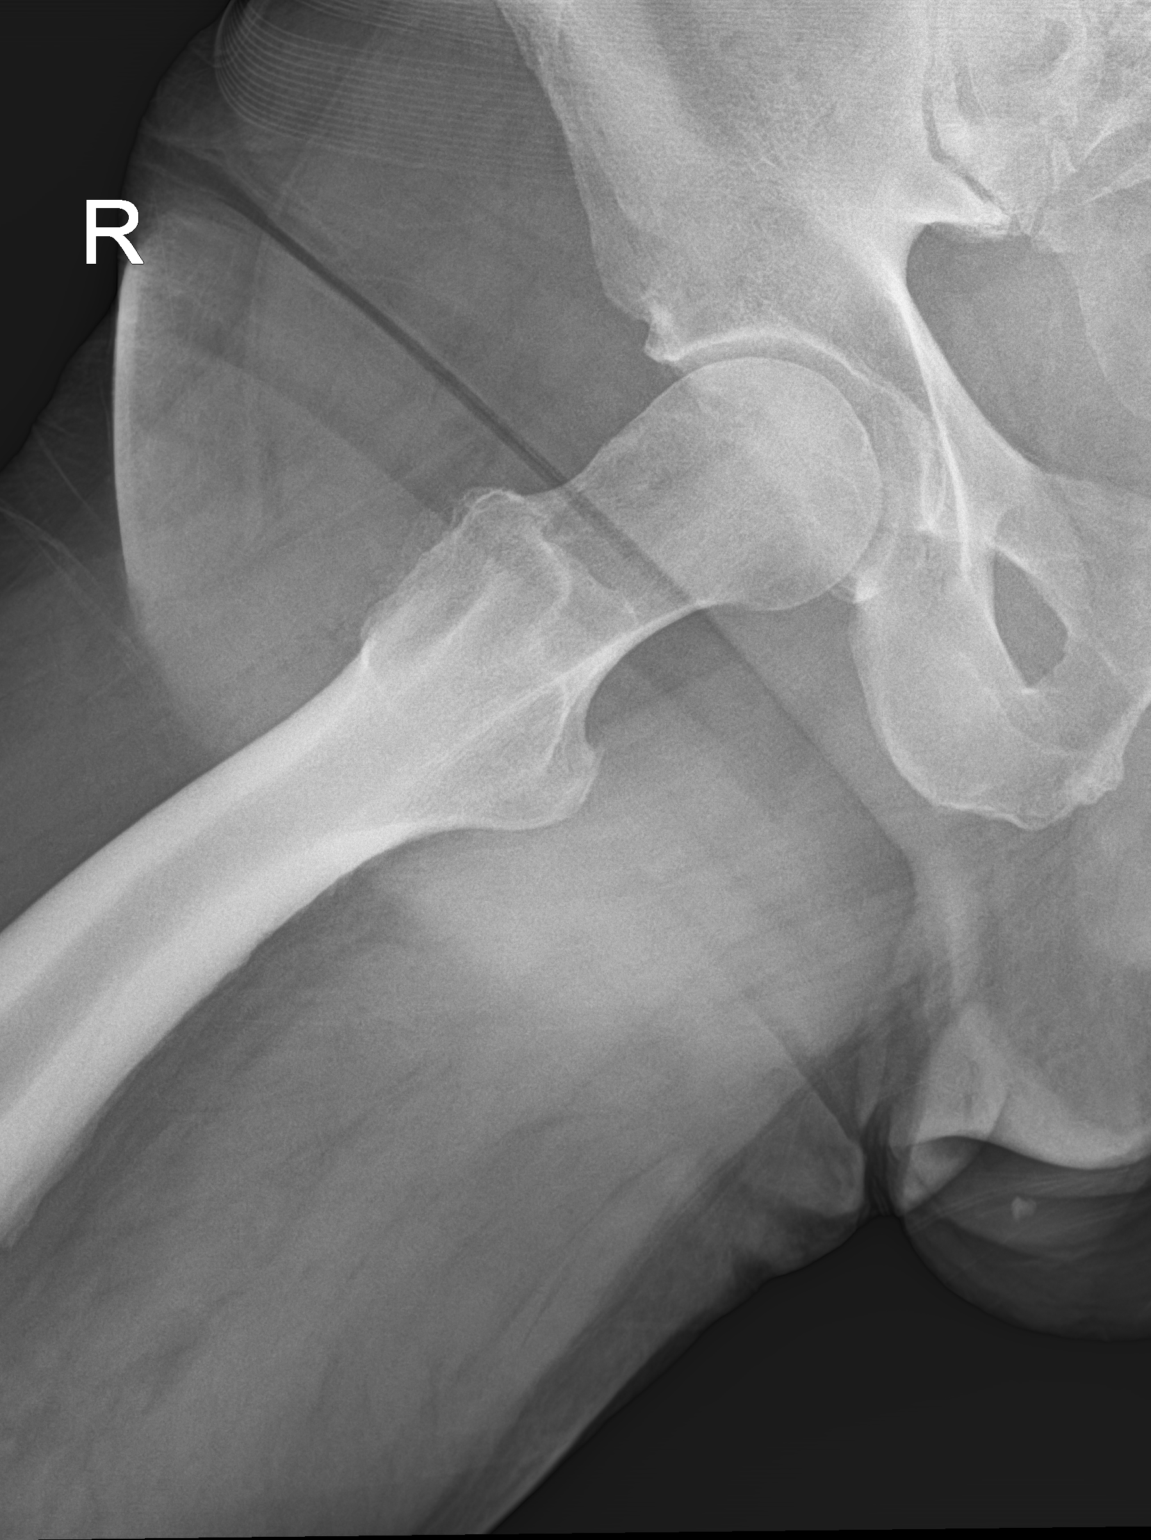

[3 of 3 positions shown; findings below may reference images not displayed]

FINDINGS: There is no evidence of hip fracture or dislocation. There is no
evidence of arthropathy or other focal bone abnormality. Left pelvic
phleboliths.
IMPRESSION: Negative.

## 2018-09-18 ENCOUNTER — Other Ambulatory Visit: Payer: Self-pay | Admitting: Family Medicine

## 2018-09-18 DIAGNOSIS — E119 Type 2 diabetes mellitus without complications: Secondary | ICD-10-CM

## 2018-09-19 NOTE — Telephone Encounter (Signed)
Left VM; Pt needs appointment.

## 2018-10-09 ENCOUNTER — Other Ambulatory Visit: Payer: Self-pay | Admitting: Family Medicine

## 2018-10-09 DIAGNOSIS — E119 Type 2 diabetes mellitus without complications: Secondary | ICD-10-CM

## 2018-10-11 NOTE — Telephone Encounter (Signed)
Requested medication (s) are due for refill today: yes  Requested medication (s) are on the active medication list: yes  Last refill:  09/18/18  Future visit scheduled:no Notes to clinic:  Message left on 09/18/18 stating pt needs appointment    Requested Prescriptions  Pending Prescriptions Disp Refills   metFORMIN (GLUCOPHAGE) 1000 MG tablet [Pharmacy Med Name: METFORMIN HCL 1,000 MG TABLET] 30 tablet 0    Sig: TAKE 1 TABLET (1,000 MG TOTAL) BY MOUTH 2 (TWO) TIMES DAILY WITH A MEAL.     Endocrinology:  Diabetes - Biguanides Failed - 10/09/2018  5:04 PM      Failed - HBA1C is between 0 and 7.9 and within 180 days    Hgb A1c MFr Bld  Date Value Ref Range Status  12/28/2017 9.8 (H) 4.8 - 5.6 % Final    Comment:             Prediabetes: 5.7 - 6.4          Diabetes: >6.4          Glycemic control for adults with diabetes: <7.0          Failed - Valid encounter within last 6 months    Recent Outpatient Visits          9 months ago Type 2 diabetes mellitus without complication, without long-term current use of insulin ([place])   [facility] [place], [place]   1 year ago Hyperlipidemia, unspecified hyperlipidemia type   [facility], Laurey Arrow, MD   1 year ago Type 2 diabetes mellitus with hyperglycemia, without long-term current use of insulin [facility])   [facility], Ranell Patrick, MD   2 years ago Annual physical exam   [facility], Ranell Patrick, MD   3 years ago Annual physical exam   [facility] at [facility], Linton Ham, MD             Passed - Cr in normal range and within 360 days    Creat  Date Value Ref Range Status  09/19/2016 0.76 0.70 - 1.25 mg/dL Final    Comment:      For patients > or = 63 years of age: The upper reference limit for Creatinine is approximately 13% higher for people identified as African-American.      Creatinine, Ser  Date Value Ref Range Status  12/28/2017 0.98 0.76 - 1.27 mg/dL  Final         Passed - eGFR in normal range and within 360 days    GFR, Est African American  Date Value Ref Range Status  09/19/2016 >89 >=60 mL/min Final   GFR calc Af Amer  Date Value Ref Range Status  12/28/2017 95 >59 mL/min/1.73 Final   GFR, Est Non African American  Date Value Ref Range Status  09/19/2016 >89 >=60 mL/min Final   GFR calc non Af Amer  Date Value Ref Range Status  12/28/2017 82 >59 mL/min/1.73 Final

## 2018-10-21 ENCOUNTER — Other Ambulatory Visit: Payer: Self-pay | Admitting: Family Medicine

## 2018-10-21 DIAGNOSIS — E119 Type 2 diabetes mellitus without complications: Secondary | ICD-10-CM

## 2018-11-05 ENCOUNTER — Other Ambulatory Visit: Payer: Self-pay | Admitting: Family Medicine

## 2018-11-05 DIAGNOSIS — E119 Type 2 diabetes mellitus without complications: Secondary | ICD-10-CM

## 2018-11-09 ENCOUNTER — Other Ambulatory Visit: Payer: Self-pay | Admitting: Family Medicine

## 2018-11-09 DIAGNOSIS — E119 Type 2 diabetes mellitus without complications: Secondary | ICD-10-CM

## 2018-11-11 NOTE — Telephone Encounter (Signed)
Attempted to call and schedule an appointment for the refill for his metformin 1000 mg tab. No answer, left message to call the office and schedule.

## 2019-05-02 ENCOUNTER — Other Ambulatory Visit: Payer: Self-pay | Admitting: Family Medicine

## 2019-05-02 DIAGNOSIS — E119 Type 2 diabetes mellitus without complications: Secondary | ICD-10-CM

## 2019-05-02 NOTE — Telephone Encounter (Signed)
Forwarding medication refill to provider for review. 

## 2020-10-18 ENCOUNTER — Telehealth: Payer: Self-pay | Admitting: Internal Medicine

## 2020-10-18 NOTE — Telephone Encounter (Signed)
Patients Sister called in wanting to schedule a Rehab follow up  ( ) Dr. Mirian Capuchin Patient.  She is going to request her brothers medical records and ask for our fax number she is going to have the rehab in [place] where Brother is located fax over a request for records.  Brother is havigf severe memory problems since Covid    Offered the [facility] info . Patients Sister  was not interested .
